# Patient Record
Sex: Male | Born: 1950
Health system: Southern US, Community
[De-identification: ages and names within clinical notes are randomized; demographics above are authoritative.]

## PROBLEM LIST (undated history)

## (undated) DIAGNOSIS — F419 Anxiety disorder, unspecified: Secondary | ICD-10-CM

## (undated) DIAGNOSIS — E7800 Pure hypercholesterolemia, unspecified: Secondary | ICD-10-CM

## (undated) DIAGNOSIS — K219 Gastro-esophageal reflux disease without esophagitis: Secondary | ICD-10-CM

## (undated) DIAGNOSIS — I10 Essential (primary) hypertension: Secondary | ICD-10-CM

## (undated) DIAGNOSIS — J61 Pneumoconiosis due to asbestos and other mineral fibers: Secondary | ICD-10-CM

## (undated) HISTORY — DX: Pure hypercholesterolemia, unspecified: E78.00

## (undated) HISTORY — DX: Pneumoconiosis due to asbestos and other mineral fibers: J61

## (undated) HISTORY — DX: Anxiety disorder, unspecified: F41.9

## (undated) HISTORY — DX: Essential (primary) hypertension: I10

## (undated) HISTORY — DX: Gastro-esophageal reflux disease without esophagitis: K21.9

---

## 2005-01-28 ENCOUNTER — Ambulatory Visit (HOSPITAL_COMMUNITY): Admission: RE | Admit: 2005-01-28 | Discharge: 2005-01-28 | Payer: Self-pay | Admitting: Thoracic Surgery

## 2005-03-08 ENCOUNTER — Inpatient Hospital Stay (HOSPITAL_COMMUNITY): Admission: RE | Admit: 2005-03-08 | Discharge: 2005-03-13 | Payer: Self-pay | Admitting: Thoracic Surgery

## 2005-03-08 ENCOUNTER — Encounter (INDEPENDENT_AMBULATORY_CARE_PROVIDER_SITE_OTHER): Payer: Self-pay | Admitting: *Deleted

## 2005-03-22 ENCOUNTER — Encounter: Admission: RE | Admit: 2005-03-22 | Discharge: 2005-03-22 | Payer: Self-pay | Admitting: Thoracic Surgery

## 2005-04-12 ENCOUNTER — Encounter: Admission: RE | Admit: 2005-04-12 | Discharge: 2005-04-12 | Payer: Self-pay | Admitting: Family Medicine

## 2005-06-01 ENCOUNTER — Encounter: Admission: RE | Admit: 2005-06-01 | Discharge: 2005-06-01 | Payer: Self-pay | Admitting: Thoracic Surgery

## 2005-07-13 ENCOUNTER — Encounter: Admission: RE | Admit: 2005-07-13 | Discharge: 2005-07-13 | Payer: Self-pay | Admitting: Thoracic Surgery

## 2005-10-12 ENCOUNTER — Encounter: Admission: RE | Admit: 2005-10-12 | Discharge: 2005-10-12 | Payer: Self-pay | Admitting: Thoracic Surgery

## 2005-12-05 IMAGING — CR DG CHEST 2V
3 series · 3 of 3 positions shown · non-contrast
Comparison: none

CLINICAL DATA: Followup pleural effusion.
 DIAGNOSTIC CHEST - 2 VIEW:

[view not recorded (1 of 3)]
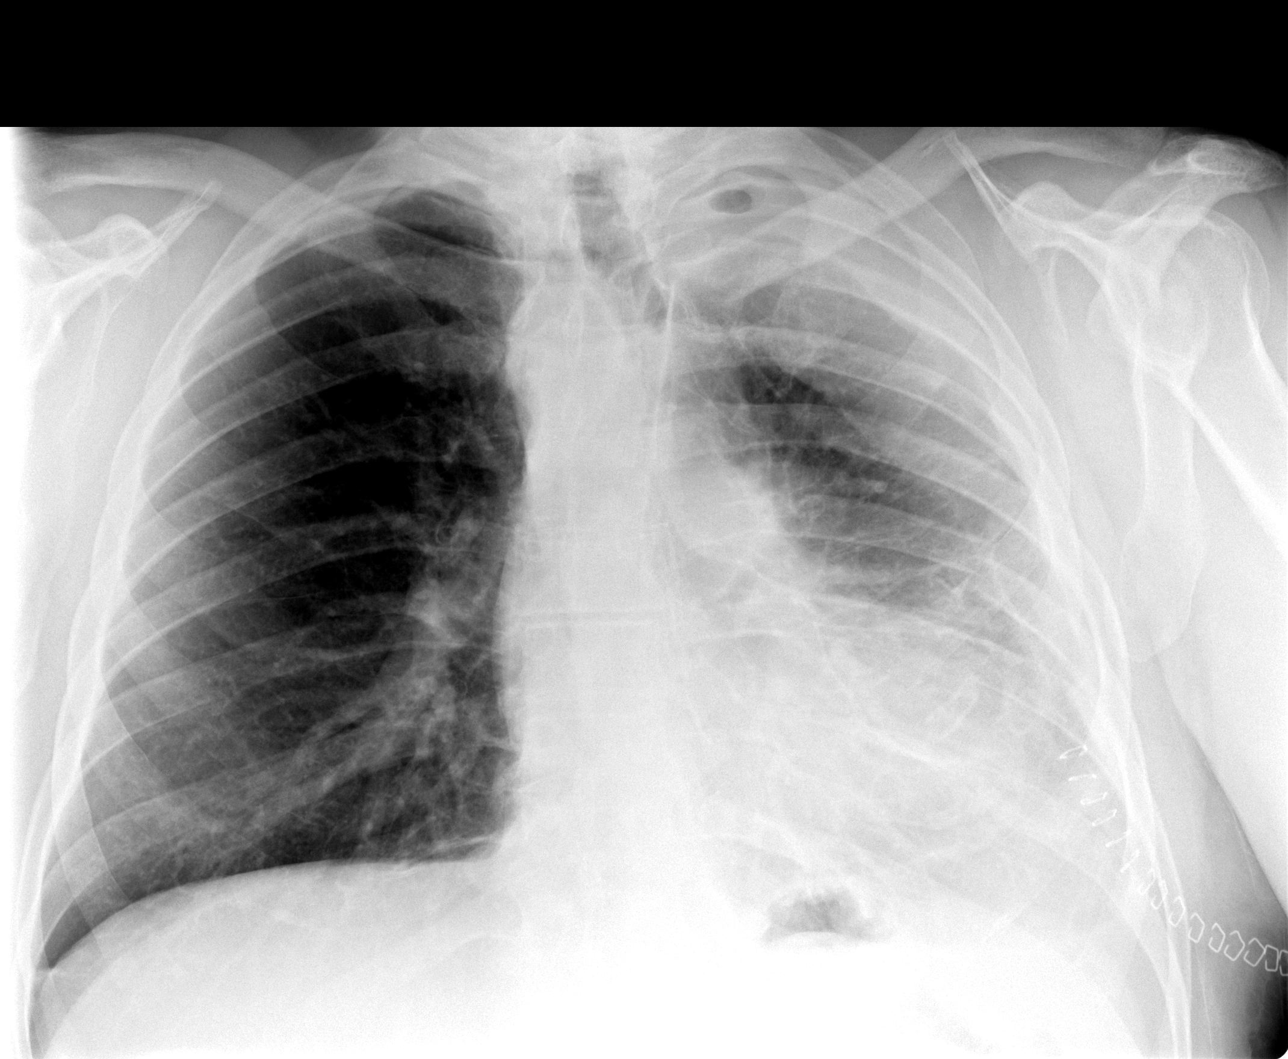

[view not recorded (2 of 3)]
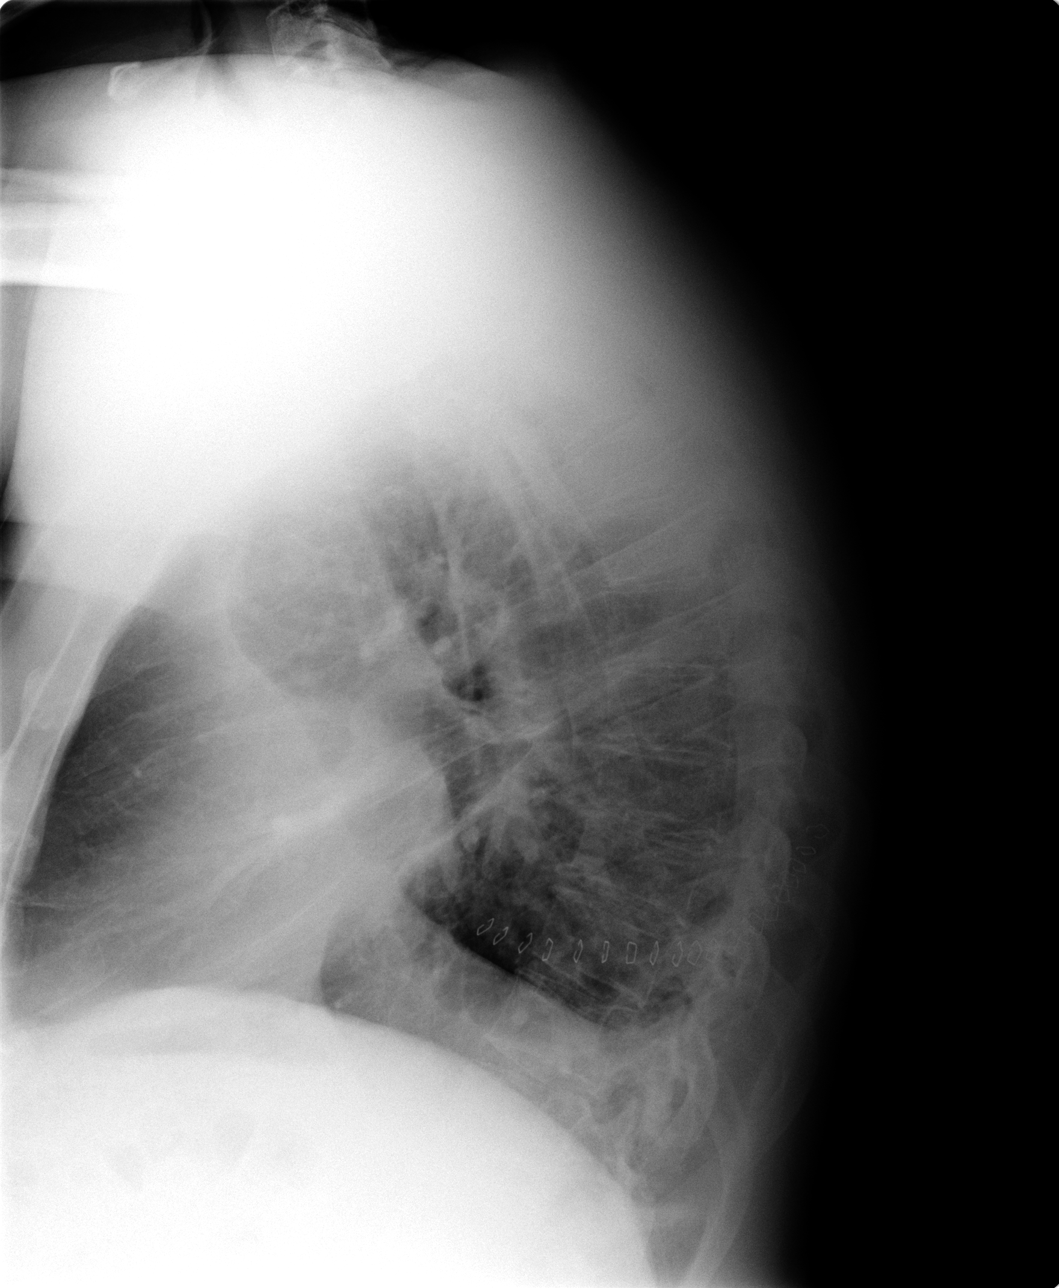

[view not recorded (3 of 3)]
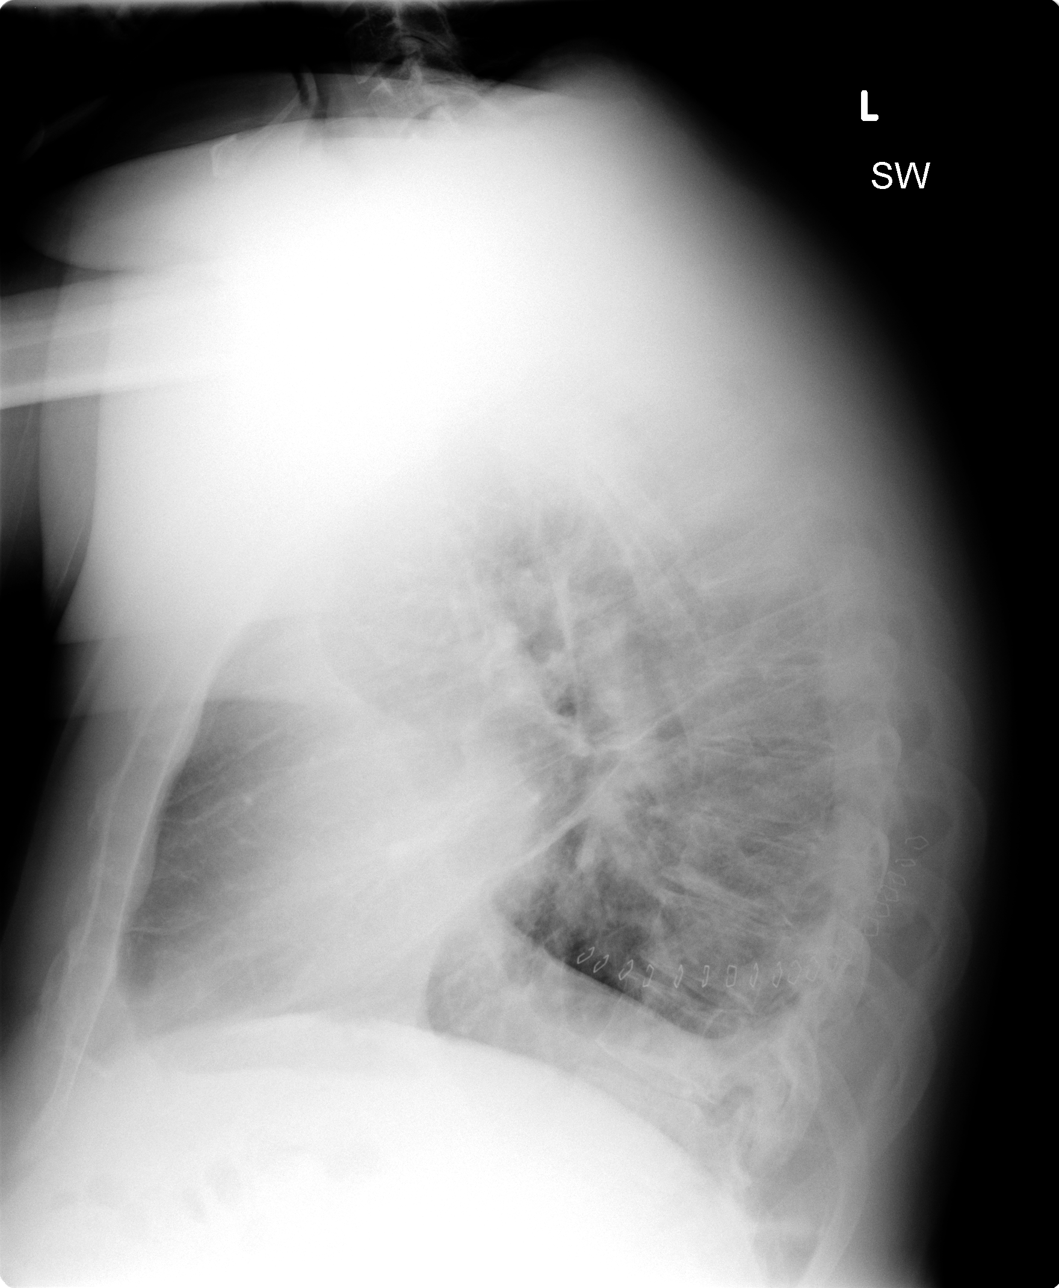

[3 of 3 positions shown; findings below may reference images not displayed]

FINDINGS: Since [REDACTED] chest x-ray 03/13/05 removal of right internal jugular central venous catheter and stable left skin thoracotomy surgical clips are seen.  Currently less than 1 cm tiny left apical loculated pneumothorax is seen with greater aeration of the left lung with otherwise stable postoperative pleural opacity and shift of mediastinum to the left.  Right lung is clear.  Heart size is not assessed.  No new abnormality is seen.
IMPRESSION: Since [REDACTED] chest x-ray 03/13/05:
 1.  Tiny left apical loculated pneumothorax. 
 2.  Greater aeration left lung with otherwise stable left thoracotomy change. 
 3.  Otherwise no active disease.

## 2005-12-26 IMAGING — CR DG CHEST 2V
2 series · 2 of 2 positions shown · non-contrast
Comparison: 03/22/2005

CLINICAL DATA: Pleural effusion

CHEST - 2 VIEW:

[w chest pa]
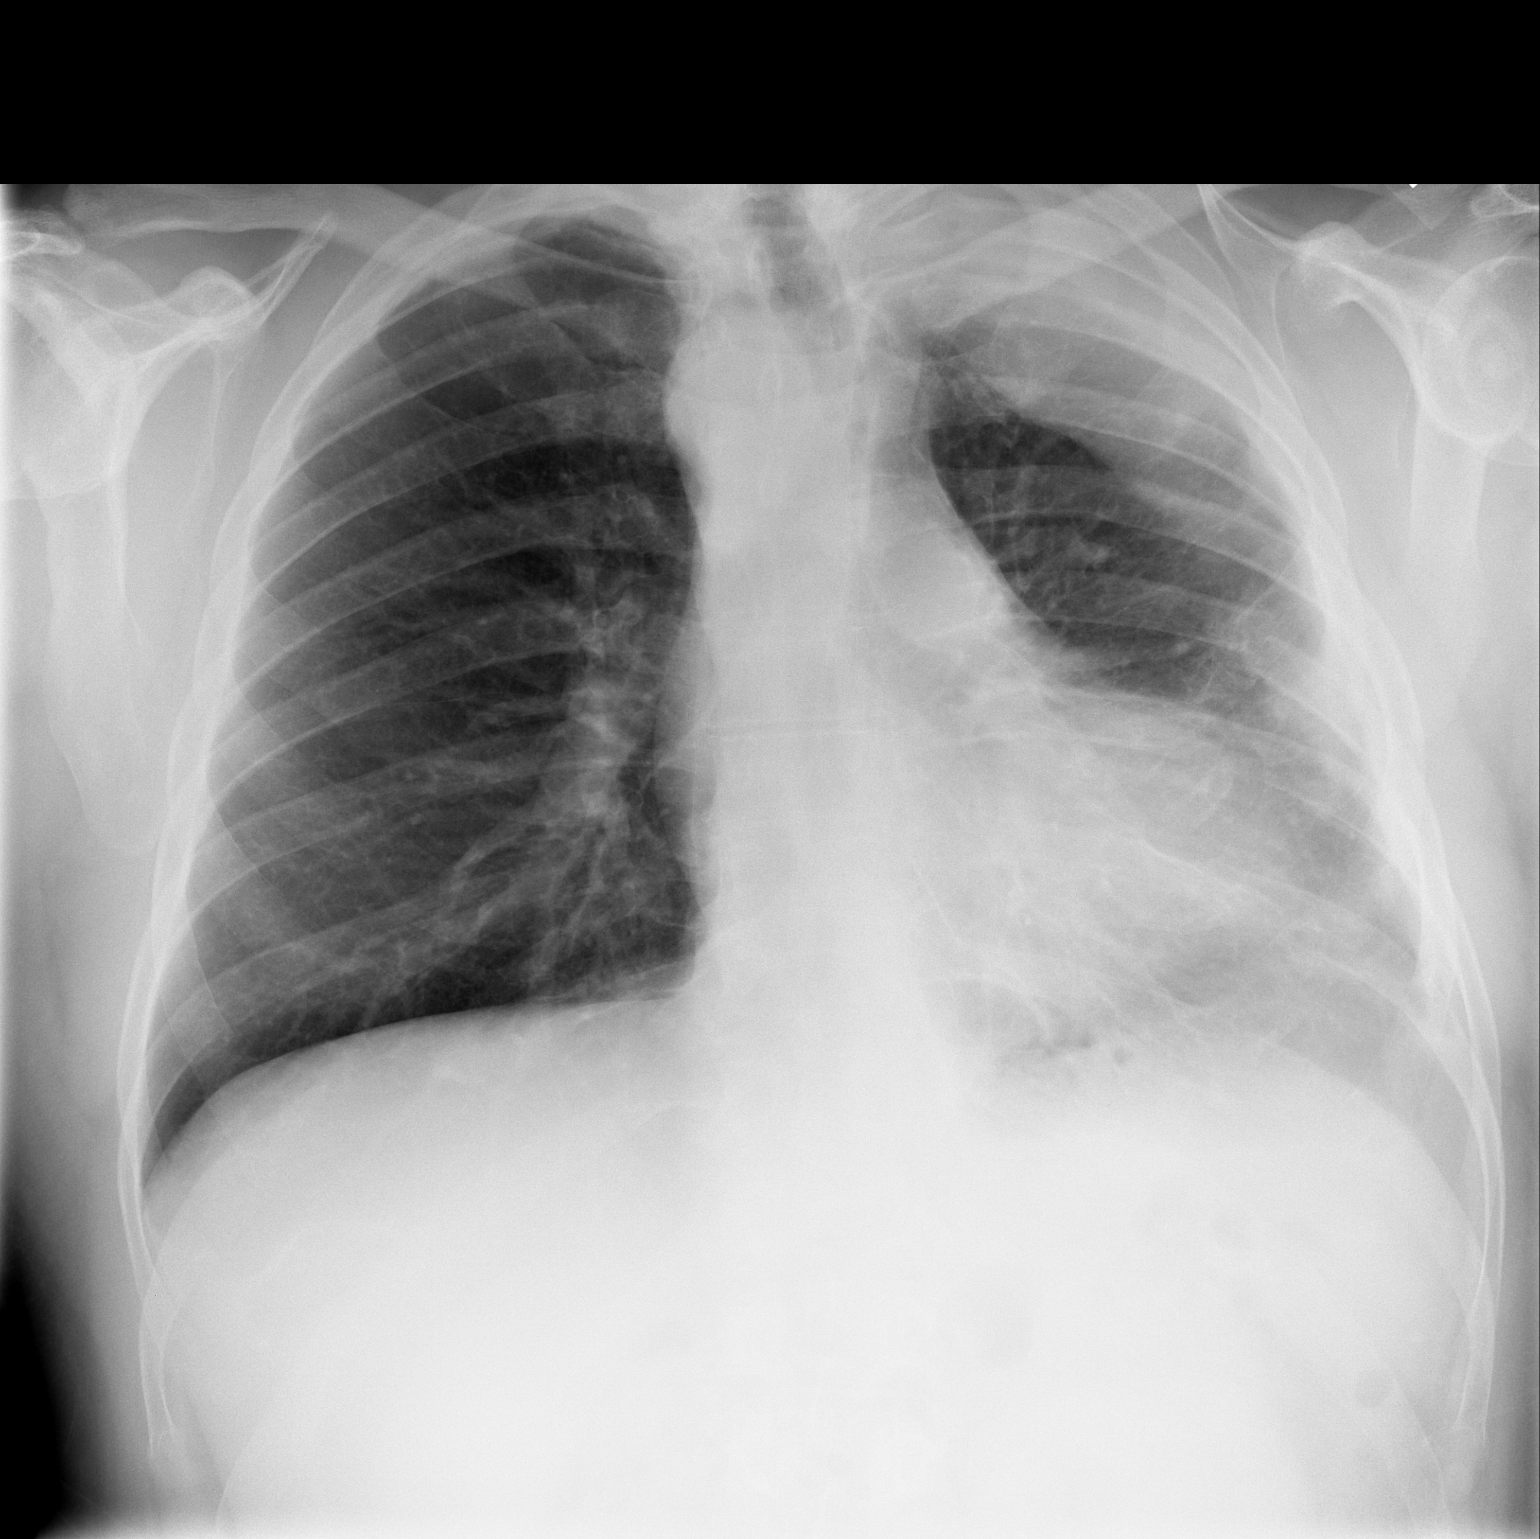

[w chest lat]
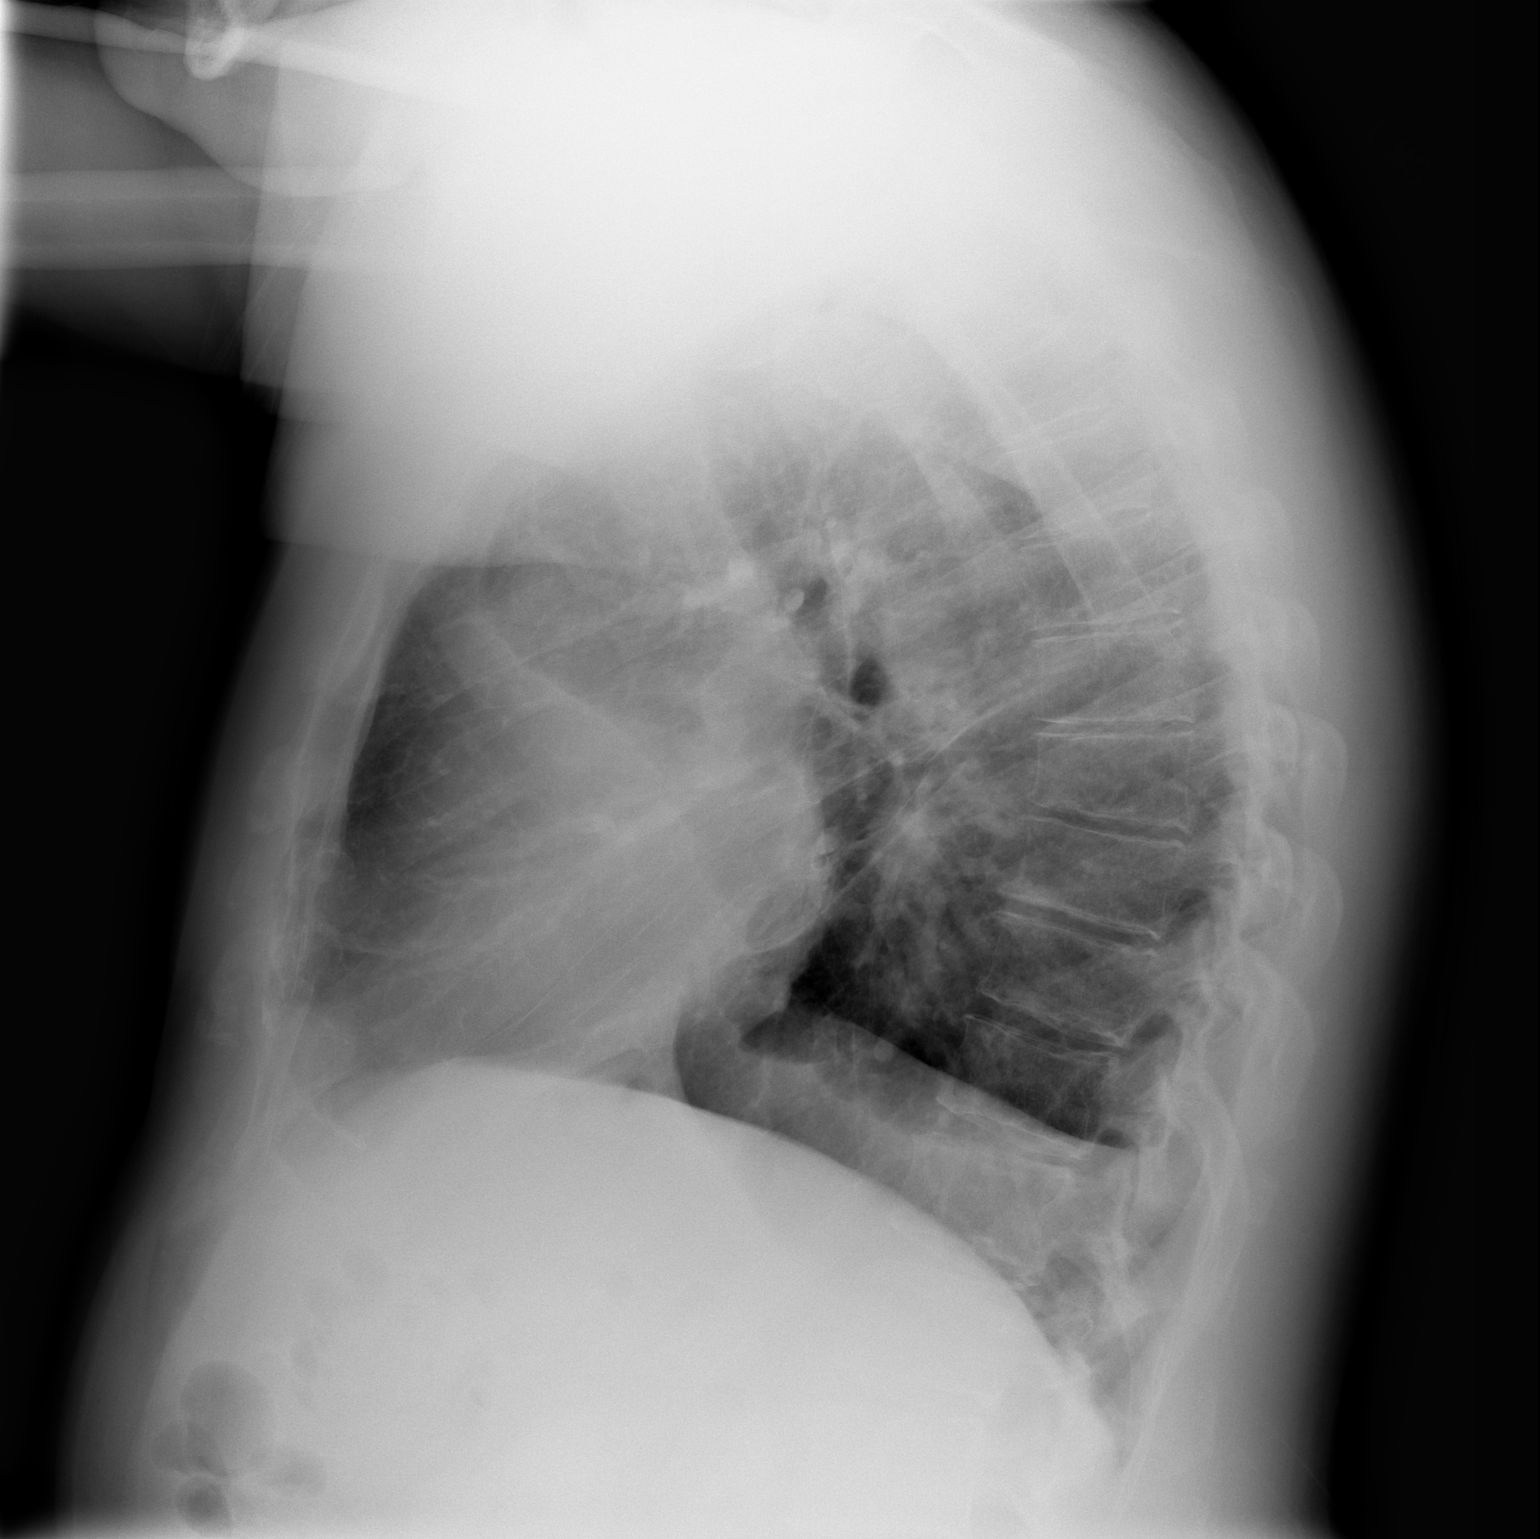

[2 of 2 positions shown; findings below may reference images not displayed]

FINDINGS: Previously small left pneumothorax has resolved. There continue to be
loculated pleural effusion and/or pleural thickening which is essentially
uncinate change since prior study. Right lung remains clear. Heart is normal
size.
IMPRESSION: Resolution of left pneumothorax with stable appearance of the loculated left
effusion and/or thickening.

## 2006-04-12 ENCOUNTER — Encounter: Admission: RE | Admit: 2006-04-12 | Discharge: 2006-04-12 | Payer: Self-pay | Admitting: Thoracic Surgery

## 2006-11-01 ENCOUNTER — Encounter: Admission: RE | Admit: 2006-11-01 | Discharge: 2006-11-01 | Payer: Self-pay | Admitting: Thoracic Surgery

## 2007-05-02 ENCOUNTER — Ambulatory Visit: Payer: Self-pay | Admitting: Thoracic Surgery

## 2007-05-02 ENCOUNTER — Encounter: Admission: RE | Admit: 2007-05-02 | Discharge: 2007-05-02 | Payer: Self-pay | Admitting: Thoracic Surgery

## 2008-05-06 ENCOUNTER — Ambulatory Visit: Payer: Self-pay | Admitting: Thoracic Surgery

## 2008-05-06 ENCOUNTER — Encounter: Admission: RE | Admit: 2008-05-06 | Discharge: 2008-05-06 | Payer: Self-pay | Admitting: Surgery

## 2009-05-20 ENCOUNTER — Encounter: Admission: RE | Admit: 2009-05-20 | Discharge: 2009-05-20 | Payer: Self-pay | Admitting: Thoracic Surgery

## 2009-05-20 ENCOUNTER — Ambulatory Visit: Payer: Self-pay | Admitting: Thoracic Surgery

## 2010-06-29 ENCOUNTER — Ambulatory Visit: Payer: Self-pay | Admitting: Thoracic Surgery

## 2010-06-29 ENCOUNTER — Encounter: Admission: RE | Admit: 2010-06-29 | Discharge: 2010-06-29 | Payer: Self-pay | Admitting: Thoracic Surgery

## 2010-12-19 ENCOUNTER — Encounter: Payer: Self-pay | Admitting: Thoracic Surgery

## 2010-12-20 ENCOUNTER — Encounter: Payer: Self-pay | Admitting: Thoracic Surgery

## 2011-04-12 NOTE — Assessment & Plan Note (Signed)
OFFICE VISIT   LASARO, PRIMM  DOB:  06-25-1951                                        May 06, 2008  CHART #:  [id number]   REASON FOR VISIT:  Mr. Yera comes in today for a 1-year followup.  He  is status post left VAP with drainage of effusion and thoracotomy with  decortication of fibrothorax.  He continues to have chest tightness on  the left, but this has been stable since his last visit.  He has had no  other changes in his medical history.   PHYSICAL EXAMINATION:  Vital Signs:  Blood pressure is 141/81, pulse is  68, respirations 18, and O2 sat 96% on room air.  His incisions have all  healed well.  Heart:  Regular rate and rhythm without murmurs, rubs, or  gallops.  Lungs:  Clear to auscultation.   Chest x-ray is stable from a previous exam.   ASSESSMENT/PLAN:  Mr. Golaszewski is doing well following his left  thoracotomy in 2006.  He has stable chest tightness and is reassured  about this.  Dr. Edwyna Shell has seen the patient today and reviewed his  chest x-ray.  We will plan to see him back again for followup in 1 year  with a repeat x-ray.   Ines Bloomer, M.D.  Electronically Signed   GC/MEDQ  D:  05/06/2008  T:  05/07/2008  Job:  [id number]   cc:   [facility] office

## 2011-04-12 NOTE — Letter (Signed)
June 29, 2010   Marcelino Duster, MD  [address] [place], [place] [address]   Re:  EMAURI, KRYGIER              DOB:  [birth year]   Dear Dr. Orvan Falconer:   The patient came for follow up today.  We have been following for a long  time for this fiber thorax.  His chest x-ray showed no evidence of  really big change.  His blood pressure was 121/74, pulse 60,  respirations 18, sats were 98%.  I will see him back one more year with  another chest x-ray just to continue to follow this.  I appreciate the  opportunity of seeing the patient.   Ines Bloomer, M.D.  Electronically Signed   DPB/MEDQ  D:  06/29/2010  T:  06/30/2010  Job:  [id number]

## 2011-04-12 NOTE — Assessment & Plan Note (Signed)
OFFICE VISIT   Benjamin Colon, Benjamin Colon  DOB:  1951-09-25                                        May 02, 2007  CHART #:  [id number]   OFFICE NOTE:  Chest x-ray stable.  He is still having some mild chest  tightness.  Chest x-ray shows stable chest with no acute pulmonary  process.  Blood pressure is 120/74, pulse 76, respirations 20,  saturating at  98%.  Since he has had no recurrence of his fibrothorax,  we plan to see him again in 1 year with a chest x-ray.   Ines Bloomer, M.D.  Electronically Signed   DPB/MEDQ  D:  05/02/2007  T:  05/02/2007  Job:  [id number]

## 2011-04-12 NOTE — Assessment & Plan Note (Signed)
OFFICE VISIT   Benjamin Colon, Benjamin Colon  DOB:  05/06/1951                                        May 20, 2009  CHART #:  [id number]   The patient came today.  His chest x-ray is stable, which shows no acute  changes.  He has gained a little weight.  He is doing well overall,  still having some tightness in his left chest.  Blood pressure was  125/75, pulse 64, respirations 18, and sats were 97%.  Lungs are clear  to auscultation and percussion.  We will see him back again in 1 year  with another chest x-ray.   Ines Bloomer, M.D.  Electronically Signed   DPB/MEDQ  D:  05/20/2009  T:  05/20/2009  Job:  [id number]

## 2011-04-15 NOTE — Op Note (Signed)
NAME:  Benjamin Colon, Benjamin Colon NO.:  [id number]   MEDICAL RECORD NO.:  [id number]          PATIENT TYPE:  INP   LOCATION:  2899                         FACILITY:  [facility]   PHYSICIAN:  Ines Bloomer, M.D. DATE OF BIRTH:  09-Apr-1951   DATE OF PROCEDURE:  03/08/2005  DATE OF DISCHARGE:                                 OPERATIVE REPORT   PREOPERATIVE DIAGNOSIS:  Left fibrothorax with pleural effusion.   POSTOPERATIVE DIAGNOSIS:  Left fibrothorax with pleural effusion.   OPERATION PERFORMED:  Left VATS, drainage of pleural effusion, left  thoracotomy with decortication.   SURGEON:  Ines Bloomer, M.D.   FIRST ASSISTANT:  Rowe Clack, P.A.-C.   ANESTHESIA:  General anesthesia.   Following general anesthesia, a double-lumen tube was inserted, and he was  turned to the left lateral thoracotomy position.  A Foley catheter was  inserted without too much difficulty, but there was some hematuria after the  insertion of the Foley catheter.  This was watched throughout the case, and  a urology consult was obtained.  After prepping and draping the left chest,  two trocar sites were made in the anterior and posterior axillary lines at  the seventh intercostal space.  Two trocars were inserted, and the pleural  effusion was evacuated and sent for cultures and cytologies.  Then, pictures  were taken of a fibrothorax with thickened pleura, and trapped left lower  lobe and left upper lobe.  Posterolateral thoracotomy was made, and it was  carried down with electrocautery through subcutaneous tissue.  The  latissimus was divided, and the serratus was reflected anteriorly.  Two  trocar sites were placed in the anterior and posterior axillary lines at the  seventh intercostal space.  A Toupet was placed in the wound, and another  Toupet was placed at right angles.  The pleural dissection was started,  taking off the thickened pleura laterally, taking it off superiorly,  inferiorly,  down to the diaphragm, and medially over to the middle  mediastinum and the pericardium.  This thickened pleura was removed by sharp  and blunt dissection using Kaiser ring forceps and dissecting spatula, and  we did really a pleurectomy, removing it up to the apex of the lung and  superiorly around to the middle mediastinum and posteriorly down to the  aorta.  After the superior portion had been removed, the inferior portion  was removed to the diaphragm, removing it out of the costophrenic angle and  the cardiophrenic angle.  Then, the inferior pulmonary ligament was  dissected up, and attention was turned to the thickened peel on the lung.  The lung was completely entrapped, with the upper lobe and the lower lobe.  The fissure could not be identified.  The thickened peel was started to be  removed from the left lower lobe, slowly dissecting the peel up with  scissors, and then using Kittners to help in the dissection.  The entire  peel was removed from the left lower lobe and dissecting superiorly up to  the fissure.  The fissure was identified, and the peel was removed from  the  fissure and then from the superior segment.  After the peel had been removed  from the left lower lobe using the same technique, it was removed from the  left upper lobe.  Frozen section of the peel revealed a benign process.  After the peel had been completely removed, there were several small tears  in the lung that were sutured with 3-0 Vicryl.  Two chest tubes were brought  in through the trocar sites and tied in place with 0 silk.  A third right-  angle chest tube was placed between the two trocar sites.  These were all 28  chest tubes.  Marcaine block was done in the  usual fashion.  The On-Q catheters were placed in the pericostal space.  #2  Vicryl pericostals were used to close the chest.  The chest was closed with  #1 Vicryl in the muscle layer, 2-0 Vicryl in the subcutaneous tissue, and  Ethicon skin  clips.  The patient was returned to the recovery room in stable  condition.      DPB/MEDQ  D:  03/08/2005  T:  03/08/2005  Job:  [id number]   cc:   Ines Bloomer, M.D.  [address]  [place]  [place] [address]   Salvatore Decent. Dorris Fetch, M.D.  [address]  [place]  [place] [address]

## 2011-04-15 NOTE — Discharge Summary (Signed)
NAME:  Benjamin Colon NO.:  [id number]   MEDICAL RECORD NO.:  [id number]          PATIENT TYPE:  INP   LOCATION:  3304                         FACILITY:  [facility]   PHYSICIAN:  Ines Bloomer, M.D. DATE OF BIRTH:  11/20/1951   DATE OF ADMISSION:  03/08/2005  DATE OF DISCHARGE:  03/13/2005                                 DISCHARGE SUMMARY   HISTORY OF PRESENT ILLNESS:  The patient is a 60 year old male with a long  history of chest pain, left pleural effusion.  He was evaluated in [facility] for asbestosis and was told he had a grade I asbestosis.  The patient  has a history of working with [employer].  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  Chest x-  ray and CT scan show a loculated left pleural effusion with scarring of his  left lower lobe.  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  Thoracentesis revealed numerous atypical  mesothelial cells and numerous eosinophils.  He has also been treated for  emphysema in the past.  A PET scan showed no abnormal metabolic activity on  the left side.  CT scan did show some scar on the pleura with atelectasis of  the left lower lobe. He was admitted for drainage of the left pleural  effusion with probable decortication.   PAST MEDICAL HISTORY:  Significant for:  1.  Hypertension.  2.  Gastroesophageal reflux disease.  3.  Hyperlipidemia.  4.  Past surgical history including appendectomy, tonsillectomy.   ALLERGIES:  No known drug allergies.   MEDICATIONS:  Prior to admission:  1.  Hyzaar 100/25, one daily.  2.  Vytorin 10/20 one daily.  3.  Nexium 40 mg daily.   FAMILY AND SOCIAL HISTORY/REVIEW OF SYMPTOMS, PHYSICAL EXAMINATION:  Please  see the history and physical done at the time of admission.   HOSPITAL COURSE:  The patient was admitted electively with the impression of  left chronic fibrothorax and loculated  effusion. He was taken to the  operating room on March 08, 2005 where he underwent a left video-assisted  thoracoscopy/thoracotomy with drainage of effusion with visceral and  parietal pleurectomy.  Multiple biopsies were obtained as well as cultures  and Cytology.  The patient tolerated the procedure well and was taken to the  post-anesthesia care unit in stable condition.   Postoperatively during his hospital course the patient did well.  It is  noted the patient did require multiple attempts to place the Foley catheter  prior to his procedure.  A urology consultation was obtained postoperatively  and there was no sequelae due to this.  The patient remained hemodynamically  stable.  He advanced in a routine manner in regards to activity and diet.  All routine lines, monitors, drainages devices were discontinued in a  standard fashion.  The incision healed well without evidence of infection or  difficulties.  Pathology was noted as follows:  The left pleural fluid  showed a mixed lymphocyte population with  rare mesothelial cells.  A biopsy  of the left pleura showed pleura to have fibrosis with predominantly chronic  inflammatory infiltrate and fibrous plaque formation.  A portion of the left  sixth rib showed no evidence of malignancy.  An initial biopsy of the left  pleura again showed pleural fibrosis and predominantly chronic inflammatory  infiltrate.   Overall, the patient did quite well in regards to his surgical recovery and  was deemed acceptable for discharge on March 13, 2005 after evaluation by  Dr. Edwyna Shell.   DISCHARGE MEDICATIONS:  Medications on discharge were as preoperatively.  Additionally for pain, Tylox one or two every 4-6 hours as needed.   DISCHARGE INSTRUCTIONS:  Patient received written instructions regarding his  medications, activity, diet, wound care and follow up.  Follow up will  include follow up with Dr. Edwyna Shell as arranged.   CONDITION ON DISCHARGE:   Stable, improved.   FINAL DIAGNOSES:  1.  Pathology as noted above for left chronic fibrothorax and loculated      effusions.  2.  Other diagnosis as previously listed per the dictation history.      Benjamin Colon   WEG/MEDQ  D:  06/15/2005  T:  06/16/2005  Job:  [id number]

## 2011-04-15 NOTE — Consult Note (Signed)
NAME:  Benjamin Colon, Benjamin Colon NO.:  [id number]   MEDICAL RECORD NO.:  [id number]          PATIENT TYPE:  INP   LOCATION:  3304                         FACILITY:  [facility]   PHYSICIAN:  Alessandra Bevels. Chase Picket, FNP-CDATE OF BIRTH:  Dec 04, 1950   DATE OF CONSULTATION:  03/08/2005  DATE OF DISCHARGE:                                   CONSULTATION   This is a consult for hematuria after Foley catheter insertion in  preparation for left VATS.  Catheter was difficult to insert, requiring  multiple attempts.   HISTORY OF PRESENT ILLNESS:  A 60 year old white male with recurrent left  pleural effusion and left pleuritic chest pain.  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  Was told he had grade I  asbestosis.  Evaluated by Dr. Edwyna Shell with extensive workup and found it  necessary to proceed with left VATS.   PAST MEDICAL HISTORY:  Significant for hypertension, GERD, hyperlipidemia,  tonsillectomy, appendectomy.   MEDICATIONS:  1.  Hyzaar 100/25 mg daily.  2.  Vytorin 10/20 mg daily.  3.  Nexium 40 mg daily.   SOCIAL HISTORY:  Nonsmoker, nondrinker.  No illegal drug use.  Married.  Works as a Merchandiser, retail for [employer].   FAMILY HISTORY:  Negative for cancer.  Positive for CAD and positive for  BPH.   REVIEW OF SYSTEMS:  Preoperatively, negative fever.  Negative chills.  Negative headache.  Left pleuritic chest pain.  Positive for shortness of  breath, reflux.  Negative melena.  Negative urgency, frequency.  Mild  hesitancy.  Previous prostatitis years ago.  Negative hematuria.  Chronic  bilateral knee pain, otherwise benign.   PHYSICAL EXAMINATION:  VITAL SIGNS:  Temp 99.3, pulse 72, respirations 20,  blood pressure 108/62.  Sats 99% on 3 liters O2.  GENERAL:  A postoperative 60 year old white male in no acute distress  without complaints.  HEENT:  Normocephalic with central line dressing and catheter on the right  side of the neck.  RESPIRATORY:  Diminished with audible air leak.  CARDIAC:  Regular rate and rhythm.  ABDOMEN:  Soft.  Nondistended.  Negative bowel sounds.  RECTAL:  Deferred.  GENITOURINARY:  Foreskin, meatus, penis, scrotum, testicles without lesions  or masses.  Old drainage and dried blood noticed at meatus.  Foley to  bedside drainage.  Urine and tubing essentially clear with mild pink tinge.  Urine in bag with brownish color.  No clots in bag.  No clots reported.  EXTREMITIES:  Negative edema.  PAS stockings in place.  NEUROLOGIC:  Alert and oriented x 3 with appropriate mood and affect.   LABS:  Preoperative UA clear.   IMPRESSION:  1.  Hematuria:  Probably trauma during Foley catheter insertion.  2.  Status post video-assisted thoracoscopic surgery with recurrent left      pleural effusion.  3.  Hypertension.  4.  Gastroesophageal reflux disease.  5.  Chronic obstructive pulmonary disease.  6.  Hyperlipidemia.   PLAN:  Foley catheter draining well.  If drainage decreases or appearance of  clots, may irrigate p.r.n., otherwise may DC  Foley when medically necessary.  The patient will need to follow up at the urology center for cystoscopy by  Dr. Darvin Neighbours.      JML/MEDQ  D:  03/08/2005  T:  03/08/2005  Job:  [id number]

## 2011-04-15 NOTE — H&P (Signed)
NAME:  Benjamin Colon, Benjamin Colon NO.:  [id number]   MEDICAL RECORD NO.:  [id number]          PATIENT TYPE:  INP   LOCATION:  NA                           FACILITY:  [facility]   PHYSICIAN:  Ines Bloomer, M.D. DATE OF BIRTH:  1951-11-27   DATE OF ADMISSION:  03/08/2005  DATE OF DISCHARGE:                                HISTORY & PHYSICAL   HISTORY OF PRESENT ILLNESS:  Recurrent left pleural effusion.   CHIEF COMPLAINT:  Recurrent left pleural effusion.   HISTORY OF PRESENT ILLNESS:  This 60 year old patient has a long history of  left chest pain, left pleural effusion.  He was evaluated in [place]  by Dr. __________ for asbestosis and was told he had grade 1 asbestosis.  He  apparently works for [employer].  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  Chest x-ray and a CT scan  that showed a loculated left pleural effusion with scarring of his left  lower lobe.  CT scan also showed that he had a right arch with an aberrant  left subclavian artery.  Thoracentesis revealed numerous atypical  mesothelial cells and numerous eosinophils.  He also had been treated for  emphysema in the past.  Ventilation perfusion scan showed __________.  He  had a PET scan that showed no abnormal metabolic activity on the left side.  CT scan did show some scar on the pleura with atelectasis of the left lower  lobe.  He is admitted for drainage of left pleural effusion with probable  decortication.   PAST MEDICAL HISTORY:  Is significant for:  1.  Hypertension.  2.  GERD.  3.  Hyperlipidemia.  4.  Previously he has had an appendectomy and a tonsillectomy.   He is NOT ALLERGIC TO ANY MEDICATION.   He is on:  1.  Hyzaar 100/25 daily.  2.  Vytorin 10-20 daily.  3.  Nexium 40 mg daily.   FAMILY HISTORY:  There is no family history of cancer in his parents.  Mother had vascular disease.   SOCIAL HISTORY:  He works as a Advertising account planner.  He does not smoke,  does not drink alcohol on a regular basis.  He is married and has 3  children.   REVIEW OF SYSTEMS:  He is 285 pounds, he is 6 feet 2 inches.  He has left  pleuritic chest pain, shortness of breath.  He has no change in his weight.  He has occasional wheezing, no excessive sputum.  CARDIAC:  No angina or  history of arrhythmias.  GI:  He has had chronic GERD but no abdominal pain,  constipation, nausea or vomiting.  GU:  No frequent urinations or kidney  stones.  VASCULAR:  No history of claudication, TIAs or DVT.  NEUROLOGICAL:  No history of dizziness, blackout or seizures.  ORTHOPEDICS:  He has chronic  joint pains in both of his knees.  PSYCHIATRIC:  No history of psychiatric  illnesses.  EYE/ENT:  No change in his eyesight.  HEMATOLOGICAL:  No history  of anemia.   Generally, he is well-developed, slightly obese Caucasian male in no acute  distress.  Blood pressure is 144/80, pulse 66, respirations 18, temperature  93%.  HEAD:  Atraumatic.  EYES:  Pupils equal, react to light and accommodation.  Extraocular  movements are normal.  EARS:  Tympanic membranes are intact.  NOSE:  There is no septal deviation.  THROAT:  Without lesions.  NECK:  Supple, there is no thyromegaly, no jugular venous distention.  CHEST:  Clear to auscultation and percussion, except the lower lobe where  there is decreased breath sounds.  HEART:  Regular sinus rhythm, no murmurs.  ABDOMEN:  Obese with no hepatosplenomegaly, bowel sounds are normal.  EXTREMITIES:  Pulses are 2+, there is no clubbing or edema.  NEUROLOGICAL:  Oriented x3.  Sensory and motor intact.  Cranial nerves II-  XII are intact.  SKIN:  Without lesion.   IMPRESSION:  1.  Left chronic fibrothorax with loculated effusion.  2.  Chronic obstructive pulmonary disease.  3.  Hypertension.  4.  Hyperlipidemia.  5.  Questionable asbestos exposure.   PLAN:  Left VATS with decortication.   SURGEON:  Dorita Sciara, M.D.      DPB/MEDQ  D:  03/06/2005  T:  03/06/2005  Job:  [id number]

## 2011-07-04 ENCOUNTER — Other Ambulatory Visit: Payer: Self-pay | Admitting: Thoracic Surgery

## 2011-07-04 DIAGNOSIS — J90 Pleural effusion, not elsewhere classified: Secondary | ICD-10-CM

## 2011-07-05 ENCOUNTER — Encounter (INDEPENDENT_AMBULATORY_CARE_PROVIDER_SITE_OTHER): Payer: 59 | Admitting: Thoracic Surgery

## 2011-07-05 ENCOUNTER — Ambulatory Visit
Admission: RE | Admit: 2011-07-05 | Discharge: 2011-07-05 | Disposition: A | Discharge status: Home / Self Care | Payer: 59 | Source: Ambulatory Visit | Attending: Thoracic Surgery | Admitting: Thoracic Surgery

## 2011-07-05 DIAGNOSIS — J8410 Pulmonary fibrosis, unspecified: Secondary | ICD-10-CM

## 2011-07-05 DIAGNOSIS — J90 Pleural effusion, not elsewhere classified: Secondary | ICD-10-CM

## 2011-07-05 NOTE — Assessment & Plan Note (Signed)
OFFICE VISIT  Benjamin Colon, Benjamin Colon DOB:  1951-06-25                                        July 05, 2011 CHART #:  [id number]  The patient came today where is now about 6 years we have been following him for followup.  He had had a decortication after fibrothorax.  His chest x-ray, stable.  There is no evidence of any progression of vague, scar.  He still has some tiny on the left side.  His lungs are clear to auscultation and percussion.  His blood pressure is 110/60, pulse 60, respirations 96.  Plan to see him back again if he has any future problems.  Ines Bloomer, M.D. Electronically Signed  DPB/MEDQ  D:  07/05/2011  T:  07/05/2011  Job:  [id number]  cc:   Marcelino Duster

## 2011-07-19 ENCOUNTER — Encounter: Payer: Self-pay | Admitting: Thoracic Surgery

## 2015-06-29 HISTORY — PX: PROSTATE BIOPSY: SHX241

## 2016-02-17 ENCOUNTER — Other Ambulatory Visit (HOSPITAL_COMMUNITY): Payer: Self-pay | Admitting: Urology

## 2016-02-17 ENCOUNTER — Other Ambulatory Visit: Payer: Self-pay | Admitting: Urology

## 2016-02-17 DIAGNOSIS — R972 Elevated prostate specific antigen [PSA]: Secondary | ICD-10-CM

## 2016-02-25 ENCOUNTER — Ambulatory Visit (HOSPITAL_COMMUNITY)
Admission: RE | Admit: 2016-02-25 | Discharge: 2016-02-25 | Disposition: A | Discharge status: Home / Self Care | Payer: 59 | Source: Ambulatory Visit | Attending: Urology | Admitting: Urology

## 2016-02-25 DIAGNOSIS — R932 Abnormal findings on diagnostic imaging of liver and biliary tract: Secondary | ICD-10-CM | POA: Insufficient documentation

## 2016-02-25 DIAGNOSIS — N400 Enlarged prostate without lower urinary tract symptoms: Secondary | ICD-10-CM | POA: Insufficient documentation

## 2016-02-25 DIAGNOSIS — R972 Elevated prostate specific antigen [PSA]: Secondary | ICD-10-CM | POA: Insufficient documentation

## 2016-02-25 LAB — POCT I-STAT CREATININE: Creatinine, Ser: 1 mg/dL (ref 0.61–1.24)

## 2016-02-25 MED ORDER — GADOBENATE DIMEGLUMINE 529 MG/ML IV SOLN
20.0000 mL | Freq: Once | INTRAVENOUS | Status: AC | PRN
  Administered 2016-02-25: 20 mL via INTRAVENOUS

## 2016-02-26 ENCOUNTER — Ambulatory Visit (HOSPITAL_COMMUNITY): Payer: Self-pay

## 2016-02-27 HISTORY — PX: PROSTATE BIOPSY: SHX241

## 2017-03-01 DIAGNOSIS — C44519 Basal cell carcinoma of skin of other part of trunk: Secondary | ICD-10-CM | POA: Diagnosis not present

## 2017-03-01 DIAGNOSIS — Z85828 Personal history of other malignant neoplasm of skin: Secondary | ICD-10-CM | POA: Diagnosis not present

## 2017-03-01 DIAGNOSIS — L570 Actinic keratosis: Secondary | ICD-10-CM | POA: Diagnosis not present

## 2017-03-01 DIAGNOSIS — D485 Neoplasm of uncertain behavior of skin: Secondary | ICD-10-CM | POA: Diagnosis not present

## 2017-03-08 DIAGNOSIS — K219 Gastro-esophageal reflux disease without esophagitis: Secondary | ICD-10-CM | POA: Diagnosis not present

## 2017-03-08 DIAGNOSIS — E782 Mixed hyperlipidemia: Secondary | ICD-10-CM | POA: Diagnosis not present

## 2017-03-08 DIAGNOSIS — R7301 Impaired fasting glucose: Secondary | ICD-10-CM | POA: Diagnosis not present

## 2017-03-08 DIAGNOSIS — I10 Essential (primary) hypertension: Secondary | ICD-10-CM | POA: Diagnosis not present

## 2017-03-08 DIAGNOSIS — D72829 Elevated white blood cell count, unspecified: Secondary | ICD-10-CM | POA: Diagnosis not present

## 2017-03-08 DIAGNOSIS — D126 Benign neoplasm of colon, unspecified: Secondary | ICD-10-CM | POA: Diagnosis not present

## 2017-03-09 DIAGNOSIS — C44519 Basal cell carcinoma of skin of other part of trunk: Secondary | ICD-10-CM | POA: Diagnosis not present

## 2017-03-13 DIAGNOSIS — R972 Elevated prostate specific antigen [PSA]: Secondary | ICD-10-CM | POA: Diagnosis not present

## 2017-03-13 DIAGNOSIS — D126 Benign neoplasm of colon, unspecified: Secondary | ICD-10-CM | POA: Diagnosis not present

## 2017-03-13 DIAGNOSIS — E782 Mixed hyperlipidemia: Secondary | ICD-10-CM | POA: Diagnosis not present

## 2017-03-13 DIAGNOSIS — R7301 Impaired fasting glucose: Secondary | ICD-10-CM | POA: Diagnosis not present

## 2017-03-13 DIAGNOSIS — D72829 Elevated white blood cell count, unspecified: Secondary | ICD-10-CM | POA: Diagnosis not present

## 2017-03-13 DIAGNOSIS — Z85820 Personal history of malignant melanoma of skin: Secondary | ICD-10-CM | POA: Diagnosis not present

## 2017-03-13 DIAGNOSIS — I10 Essential (primary) hypertension: Secondary | ICD-10-CM | POA: Diagnosis not present

## 2017-03-13 DIAGNOSIS — K219 Gastro-esophageal reflux disease without esophagitis: Secondary | ICD-10-CM | POA: Diagnosis not present

## 2017-04-12 DIAGNOSIS — R972 Elevated prostate specific antigen [PSA]: Secondary | ICD-10-CM | POA: Diagnosis not present

## 2017-05-15 DIAGNOSIS — H1013 Acute atopic conjunctivitis, bilateral: Secondary | ICD-10-CM | POA: Diagnosis not present

## 2017-05-15 DIAGNOSIS — H40033 Anatomical narrow angle, bilateral: Secondary | ICD-10-CM | POA: Diagnosis not present

## 2017-09-14 DIAGNOSIS — Z85820 Personal history of malignant melanoma of skin: Secondary | ICD-10-CM | POA: Diagnosis not present

## 2017-09-14 DIAGNOSIS — E559 Vitamin D deficiency, unspecified: Secondary | ICD-10-CM | POA: Diagnosis not present

## 2017-09-14 DIAGNOSIS — D72829 Elevated white blood cell count, unspecified: Secondary | ICD-10-CM | POA: Diagnosis not present

## 2017-09-14 DIAGNOSIS — E782 Mixed hyperlipidemia: Secondary | ICD-10-CM | POA: Diagnosis not present

## 2017-09-14 DIAGNOSIS — K219 Gastro-esophageal reflux disease without esophagitis: Secondary | ICD-10-CM | POA: Diagnosis not present

## 2017-09-14 DIAGNOSIS — I10 Essential (primary) hypertension: Secondary | ICD-10-CM | POA: Diagnosis not present

## 2017-09-14 DIAGNOSIS — R7301 Impaired fasting glucose: Secondary | ICD-10-CM | POA: Diagnosis not present

## 2017-09-14 DIAGNOSIS — R972 Elevated prostate specific antigen [PSA]: Secondary | ICD-10-CM | POA: Diagnosis not present

## 2017-09-18 DIAGNOSIS — R7301 Impaired fasting glucose: Secondary | ICD-10-CM | POA: Diagnosis not present

## 2017-09-18 DIAGNOSIS — L570 Actinic keratosis: Secondary | ICD-10-CM | POA: Diagnosis not present

## 2017-09-18 DIAGNOSIS — E782 Mixed hyperlipidemia: Secondary | ICD-10-CM | POA: Diagnosis not present

## 2017-09-18 DIAGNOSIS — D126 Benign neoplasm of colon, unspecified: Secondary | ICD-10-CM | POA: Diagnosis not present

## 2017-09-18 DIAGNOSIS — I10 Essential (primary) hypertension: Secondary | ICD-10-CM | POA: Diagnosis not present

## 2017-09-18 DIAGNOSIS — Z0001 Encounter for general adult medical examination with abnormal findings: Secondary | ICD-10-CM | POA: Diagnosis not present

## 2017-09-18 DIAGNOSIS — Z85828 Personal history of other malignant neoplasm of skin: Secondary | ICD-10-CM | POA: Diagnosis not present

## 2017-09-18 DIAGNOSIS — R972 Elevated prostate specific antigen [PSA]: Secondary | ICD-10-CM | POA: Diagnosis not present

## 2017-09-18 DIAGNOSIS — D72829 Elevated white blood cell count, unspecified: Secondary | ICD-10-CM | POA: Diagnosis not present

## 2017-09-18 DIAGNOSIS — Z23 Encounter for immunization: Secondary | ICD-10-CM | POA: Diagnosis not present

## 2017-09-20 DIAGNOSIS — R9431 Abnormal electrocardiogram [ECG] [EKG]: Secondary | ICD-10-CM | POA: Diagnosis not present

## 2017-12-01 ENCOUNTER — Other Ambulatory Visit (HOSPITAL_COMMUNITY)
Admission: RE | Admit: 2017-12-01 | Discharge: 2017-12-01 | Disposition: A | Discharge status: Home / Self Care | Payer: Medicare Other | Source: Other Acute Inpatient Hospital | Attending: Family Medicine | Admitting: Family Medicine

## 2017-12-01 ENCOUNTER — Ambulatory Visit: Payer: Medicare Other | Admitting: Urology

## 2017-12-01 DIAGNOSIS — R972 Elevated prostate specific antigen [PSA]: Secondary | ICD-10-CM

## 2017-12-01 DIAGNOSIS — N390 Urinary tract infection, site not specified: Secondary | ICD-10-CM | POA: Insufficient documentation

## 2017-12-01 LAB — URINALYSIS, COMPLETE (UACMP) WITH MICROSCOPIC
BILIRUBIN URINE: NEGATIVE
GLUCOSE, UA: NEGATIVE mg/dL
KETONES UR: NEGATIVE mg/dL
Nitrite: POSITIVE — AB
Protein, ur: NEGATIVE mg/dL
Specific Gravity, Urine: 1.017 (ref 1.005–1.030)
pH: 6 (ref 5.0–8.0)

## 2017-12-03 LAB — URINE CULTURE: Culture: >=100000 — AB

## 2018-03-20 DIAGNOSIS — L821 Other seborrheic keratosis: Secondary | ICD-10-CM | POA: Diagnosis not present

## 2018-03-20 DIAGNOSIS — R972 Elevated prostate specific antigen [PSA]: Secondary | ICD-10-CM | POA: Diagnosis not present

## 2018-03-20 DIAGNOSIS — N390 Urinary tract infection, site not specified: Secondary | ICD-10-CM | POA: Diagnosis not present

## 2018-03-20 DIAGNOSIS — Z85828 Personal history of other malignant neoplasm of skin: Secondary | ICD-10-CM | POA: Diagnosis not present

## 2018-03-20 DIAGNOSIS — L570 Actinic keratosis: Secondary | ICD-10-CM | POA: Diagnosis not present

## 2018-03-23 DIAGNOSIS — R7301 Impaired fasting glucose: Secondary | ICD-10-CM | POA: Diagnosis not present

## 2018-03-23 DIAGNOSIS — Z1331 Encounter for screening for depression: Secondary | ICD-10-CM | POA: Diagnosis not present

## 2018-03-23 DIAGNOSIS — E782 Mixed hyperlipidemia: Secondary | ICD-10-CM | POA: Diagnosis not present

## 2018-03-23 DIAGNOSIS — R972 Elevated prostate specific antigen [PSA]: Secondary | ICD-10-CM | POA: Diagnosis not present

## 2018-03-23 DIAGNOSIS — I10 Essential (primary) hypertension: Secondary | ICD-10-CM | POA: Diagnosis not present

## 2018-03-23 DIAGNOSIS — Z1389 Encounter for screening for other disorder: Secondary | ICD-10-CM | POA: Diagnosis not present

## 2018-03-23 DIAGNOSIS — M7501 Adhesive capsulitis of right shoulder: Secondary | ICD-10-CM | POA: Diagnosis not present

## 2018-03-29 DIAGNOSIS — M2560 Stiffness of unspecified joint, not elsewhere classified: Secondary | ICD-10-CM | POA: Diagnosis not present

## 2018-03-29 DIAGNOSIS — I10 Essential (primary) hypertension: Secondary | ICD-10-CM | POA: Diagnosis not present

## 2018-03-29 DIAGNOSIS — Z77090 Contact with and (suspected) exposure to asbestos: Secondary | ICD-10-CM | POA: Diagnosis not present

## 2018-03-29 DIAGNOSIS — M25611 Stiffness of right shoulder, not elsewhere classified: Secondary | ICD-10-CM | POA: Diagnosis not present

## 2018-03-29 DIAGNOSIS — M25511 Pain in right shoulder: Secondary | ICD-10-CM | POA: Diagnosis not present

## 2018-03-29 DIAGNOSIS — M6281 Muscle weakness (generalized): Secondary | ICD-10-CM | POA: Diagnosis not present

## 2018-03-29 DIAGNOSIS — M62511 Muscle wasting and atrophy, not elsewhere classified, right shoulder: Secondary | ICD-10-CM | POA: Diagnosis not present

## 2018-03-30 ENCOUNTER — Ambulatory Visit: Payer: Medicare Other | Admitting: Urology

## 2018-03-30 ENCOUNTER — Other Ambulatory Visit (HOSPITAL_COMMUNITY)
Admission: AD | Admit: 2018-03-30 | Discharge: 2018-03-30 | Disposition: A | Discharge status: Home / Self Care | Payer: Medicare Other | Source: Other Acute Inpatient Hospital | Attending: Urology | Admitting: Urology

## 2018-03-30 DIAGNOSIS — N390 Urinary tract infection, site not specified: Secondary | ICD-10-CM | POA: Diagnosis not present

## 2018-03-30 DIAGNOSIS — R972 Elevated prostate specific antigen [PSA]: Secondary | ICD-10-CM

## 2018-03-30 DIAGNOSIS — R310 Gross hematuria: Secondary | ICD-10-CM | POA: Diagnosis not present

## 2018-04-01 LAB — URINE CULTURE: Culture: >=100000 — AB

## 2018-04-04 ENCOUNTER — Other Ambulatory Visit: Payer: Self-pay | Admitting: Urology

## 2018-04-04 DIAGNOSIS — M2560 Stiffness of unspecified joint, not elsewhere classified: Secondary | ICD-10-CM | POA: Diagnosis not present

## 2018-04-04 DIAGNOSIS — Z77090 Contact with and (suspected) exposure to asbestos: Secondary | ICD-10-CM | POA: Diagnosis not present

## 2018-04-04 DIAGNOSIS — R319 Hematuria, unspecified: Secondary | ICD-10-CM

## 2018-04-04 DIAGNOSIS — N390 Urinary tract infection, site not specified: Secondary | ICD-10-CM

## 2018-04-04 DIAGNOSIS — M62511 Muscle wasting and atrophy, not elsewhere classified, right shoulder: Secondary | ICD-10-CM | POA: Diagnosis not present

## 2018-04-04 DIAGNOSIS — M25611 Stiffness of right shoulder, not elsewhere classified: Secondary | ICD-10-CM | POA: Diagnosis not present

## 2018-04-04 DIAGNOSIS — I10 Essential (primary) hypertension: Secondary | ICD-10-CM | POA: Diagnosis not present

## 2018-04-04 DIAGNOSIS — M25511 Pain in right shoulder: Secondary | ICD-10-CM | POA: Diagnosis not present

## 2018-04-04 DIAGNOSIS — M6281 Muscle weakness (generalized): Secondary | ICD-10-CM | POA: Diagnosis not present

## 2018-04-04 DIAGNOSIS — R310 Gross hematuria: Secondary | ICD-10-CM

## 2018-04-06 DIAGNOSIS — Z77090 Contact with and (suspected) exposure to asbestos: Secondary | ICD-10-CM | POA: Diagnosis not present

## 2018-04-06 DIAGNOSIS — M25611 Stiffness of right shoulder, not elsewhere classified: Secondary | ICD-10-CM | POA: Diagnosis not present

## 2018-04-06 DIAGNOSIS — I10 Essential (primary) hypertension: Secondary | ICD-10-CM | POA: Diagnosis not present

## 2018-04-06 DIAGNOSIS — M62511 Muscle wasting and atrophy, not elsewhere classified, right shoulder: Secondary | ICD-10-CM | POA: Diagnosis not present

## 2018-04-06 DIAGNOSIS — M25511 Pain in right shoulder: Secondary | ICD-10-CM | POA: Diagnosis not present

## 2018-04-06 DIAGNOSIS — M2560 Stiffness of unspecified joint, not elsewhere classified: Secondary | ICD-10-CM | POA: Diagnosis not present

## 2018-04-06 DIAGNOSIS — M6281 Muscle weakness (generalized): Secondary | ICD-10-CM | POA: Diagnosis not present

## 2018-04-09 DIAGNOSIS — M2560 Stiffness of unspecified joint, not elsewhere classified: Secondary | ICD-10-CM | POA: Diagnosis not present

## 2018-04-09 DIAGNOSIS — M25611 Stiffness of right shoulder, not elsewhere classified: Secondary | ICD-10-CM | POA: Diagnosis not present

## 2018-04-09 DIAGNOSIS — M6281 Muscle weakness (generalized): Secondary | ICD-10-CM | POA: Diagnosis not present

## 2018-04-09 DIAGNOSIS — M62511 Muscle wasting and atrophy, not elsewhere classified, right shoulder: Secondary | ICD-10-CM | POA: Diagnosis not present

## 2018-04-09 DIAGNOSIS — Z77090 Contact with and (suspected) exposure to asbestos: Secondary | ICD-10-CM | POA: Diagnosis not present

## 2018-04-09 DIAGNOSIS — M25511 Pain in right shoulder: Secondary | ICD-10-CM | POA: Diagnosis not present

## 2018-04-09 DIAGNOSIS — I10 Essential (primary) hypertension: Secondary | ICD-10-CM | POA: Diagnosis not present

## 2018-04-12 DIAGNOSIS — M2560 Stiffness of unspecified joint, not elsewhere classified: Secondary | ICD-10-CM | POA: Diagnosis not present

## 2018-04-12 DIAGNOSIS — M62511 Muscle wasting and atrophy, not elsewhere classified, right shoulder: Secondary | ICD-10-CM | POA: Diagnosis not present

## 2018-04-12 DIAGNOSIS — M6281 Muscle weakness (generalized): Secondary | ICD-10-CM | POA: Diagnosis not present

## 2018-04-12 DIAGNOSIS — I10 Essential (primary) hypertension: Secondary | ICD-10-CM | POA: Diagnosis not present

## 2018-04-12 DIAGNOSIS — Z77090 Contact with and (suspected) exposure to asbestos: Secondary | ICD-10-CM | POA: Diagnosis not present

## 2018-04-12 DIAGNOSIS — M25511 Pain in right shoulder: Secondary | ICD-10-CM | POA: Diagnosis not present

## 2018-04-12 DIAGNOSIS — M25611 Stiffness of right shoulder, not elsewhere classified: Secondary | ICD-10-CM | POA: Diagnosis not present

## 2018-04-17 DIAGNOSIS — R1084 Generalized abdominal pain: Secondary | ICD-10-CM | POA: Diagnosis not present

## 2018-04-17 DIAGNOSIS — K5900 Constipation, unspecified: Secondary | ICD-10-CM | POA: Diagnosis not present

## 2018-04-17 DIAGNOSIS — R109 Unspecified abdominal pain: Secondary | ICD-10-CM | POA: Diagnosis not present

## 2018-04-17 DIAGNOSIS — I10 Essential (primary) hypertension: Secondary | ICD-10-CM | POA: Diagnosis not present

## 2018-04-17 DIAGNOSIS — Z79899 Other long term (current) drug therapy: Secondary | ICD-10-CM | POA: Diagnosis not present

## 2018-04-25 DIAGNOSIS — M25611 Stiffness of right shoulder, not elsewhere classified: Secondary | ICD-10-CM | POA: Diagnosis not present

## 2018-04-25 DIAGNOSIS — Z77090 Contact with and (suspected) exposure to asbestos: Secondary | ICD-10-CM | POA: Diagnosis not present

## 2018-04-25 DIAGNOSIS — M2560 Stiffness of unspecified joint, not elsewhere classified: Secondary | ICD-10-CM | POA: Diagnosis not present

## 2018-04-25 DIAGNOSIS — M25511 Pain in right shoulder: Secondary | ICD-10-CM | POA: Diagnosis not present

## 2018-04-25 DIAGNOSIS — M6281 Muscle weakness (generalized): Secondary | ICD-10-CM | POA: Diagnosis not present

## 2018-04-25 DIAGNOSIS — M62511 Muscle wasting and atrophy, not elsewhere classified, right shoulder: Secondary | ICD-10-CM | POA: Diagnosis not present

## 2018-04-25 DIAGNOSIS — I10 Essential (primary) hypertension: Secondary | ICD-10-CM | POA: Diagnosis not present

## 2018-04-26 ENCOUNTER — Ambulatory Visit (HOSPITAL_COMMUNITY)
Admission: RE | Admit: 2018-04-26 | Discharge: 2018-04-26 | Disposition: A | Discharge status: Home / Self Care | Payer: Medicare Other | Source: Ambulatory Visit | Attending: Urology | Admitting: Urology

## 2018-04-26 DIAGNOSIS — R319 Hematuria, unspecified: Secondary | ICD-10-CM

## 2018-04-26 DIAGNOSIS — N390 Urinary tract infection, site not specified: Secondary | ICD-10-CM | POA: Insufficient documentation

## 2018-04-26 DIAGNOSIS — R310 Gross hematuria: Secondary | ICD-10-CM | POA: Diagnosis not present

## 2018-04-26 LAB — POCT I-STAT CREATININE: CREATININE: 0.9 mg/dL (ref 0.61–1.24)

## 2018-04-26 MED ORDER — IOPAMIDOL (ISOVUE-300) INJECTION 61%
125.0000 mL | Freq: Once | INTRAVENOUS | Status: AC | PRN
  Administered 2018-04-26: 125 mL via INTRAVENOUS

## 2018-04-27 DIAGNOSIS — M2560 Stiffness of unspecified joint, not elsewhere classified: Secondary | ICD-10-CM | POA: Diagnosis not present

## 2018-04-27 DIAGNOSIS — I10 Essential (primary) hypertension: Secondary | ICD-10-CM | POA: Diagnosis not present

## 2018-04-27 DIAGNOSIS — M6281 Muscle weakness (generalized): Secondary | ICD-10-CM | POA: Diagnosis not present

## 2018-04-27 DIAGNOSIS — M25511 Pain in right shoulder: Secondary | ICD-10-CM | POA: Diagnosis not present

## 2018-04-27 DIAGNOSIS — Z77090 Contact with and (suspected) exposure to asbestos: Secondary | ICD-10-CM | POA: Diagnosis not present

## 2018-04-27 DIAGNOSIS — M62511 Muscle wasting and atrophy, not elsewhere classified, right shoulder: Secondary | ICD-10-CM | POA: Diagnosis not present

## 2018-04-27 DIAGNOSIS — M25611 Stiffness of right shoulder, not elsewhere classified: Secondary | ICD-10-CM | POA: Diagnosis not present

## 2018-04-30 DIAGNOSIS — M25611 Stiffness of right shoulder, not elsewhere classified: Secondary | ICD-10-CM | POA: Diagnosis not present

## 2018-04-30 DIAGNOSIS — M62511 Muscle wasting and atrophy, not elsewhere classified, right shoulder: Secondary | ICD-10-CM | POA: Diagnosis not present

## 2018-04-30 DIAGNOSIS — M6281 Muscle weakness (generalized): Secondary | ICD-10-CM | POA: Diagnosis not present

## 2018-04-30 DIAGNOSIS — M7501 Adhesive capsulitis of right shoulder: Secondary | ICD-10-CM | POA: Diagnosis not present

## 2018-04-30 DIAGNOSIS — M2560 Stiffness of unspecified joint, not elsewhere classified: Secondary | ICD-10-CM | POA: Diagnosis not present

## 2018-04-30 DIAGNOSIS — I10 Essential (primary) hypertension: Secondary | ICD-10-CM | POA: Diagnosis not present

## 2018-04-30 DIAGNOSIS — M25511 Pain in right shoulder: Secondary | ICD-10-CM | POA: Diagnosis not present

## 2018-04-30 DIAGNOSIS — Z77090 Contact with and (suspected) exposure to asbestos: Secondary | ICD-10-CM | POA: Diagnosis not present

## 2018-05-02 DIAGNOSIS — I10 Essential (primary) hypertension: Secondary | ICD-10-CM | POA: Diagnosis not present

## 2018-05-02 DIAGNOSIS — M2560 Stiffness of unspecified joint, not elsewhere classified: Secondary | ICD-10-CM | POA: Diagnosis not present

## 2018-05-02 DIAGNOSIS — M62511 Muscle wasting and atrophy, not elsewhere classified, right shoulder: Secondary | ICD-10-CM | POA: Diagnosis not present

## 2018-05-02 DIAGNOSIS — M25511 Pain in right shoulder: Secondary | ICD-10-CM | POA: Diagnosis not present

## 2018-05-02 DIAGNOSIS — Z77090 Contact with and (suspected) exposure to asbestos: Secondary | ICD-10-CM | POA: Diagnosis not present

## 2018-05-02 DIAGNOSIS — M6281 Muscle weakness (generalized): Secondary | ICD-10-CM | POA: Diagnosis not present

## 2018-05-02 DIAGNOSIS — M25611 Stiffness of right shoulder, not elsewhere classified: Secondary | ICD-10-CM | POA: Diagnosis not present

## 2018-05-04 DIAGNOSIS — N390 Urinary tract infection, site not specified: Secondary | ICD-10-CM | POA: Diagnosis not present

## 2018-05-04 DIAGNOSIS — R310 Gross hematuria: Secondary | ICD-10-CM | POA: Diagnosis not present

## 2018-05-04 DIAGNOSIS — R972 Elevated prostate specific antigen [PSA]: Secondary | ICD-10-CM | POA: Diagnosis not present

## 2018-05-07 DIAGNOSIS — I10 Essential (primary) hypertension: Secondary | ICD-10-CM | POA: Diagnosis not present

## 2018-05-07 DIAGNOSIS — M25611 Stiffness of right shoulder, not elsewhere classified: Secondary | ICD-10-CM | POA: Diagnosis not present

## 2018-05-07 DIAGNOSIS — M25511 Pain in right shoulder: Secondary | ICD-10-CM | POA: Diagnosis not present

## 2018-05-07 DIAGNOSIS — M6281 Muscle weakness (generalized): Secondary | ICD-10-CM | POA: Diagnosis not present

## 2018-05-07 DIAGNOSIS — M62511 Muscle wasting and atrophy, not elsewhere classified, right shoulder: Secondary | ICD-10-CM | POA: Diagnosis not present

## 2018-05-07 DIAGNOSIS — Z77090 Contact with and (suspected) exposure to asbestos: Secondary | ICD-10-CM | POA: Diagnosis not present

## 2018-05-07 DIAGNOSIS — M2560 Stiffness of unspecified joint, not elsewhere classified: Secondary | ICD-10-CM | POA: Diagnosis not present

## 2018-05-09 DIAGNOSIS — M62511 Muscle wasting and atrophy, not elsewhere classified, right shoulder: Secondary | ICD-10-CM | POA: Diagnosis not present

## 2018-05-09 DIAGNOSIS — M25611 Stiffness of right shoulder, not elsewhere classified: Secondary | ICD-10-CM | POA: Diagnosis not present

## 2018-05-09 DIAGNOSIS — M25511 Pain in right shoulder: Secondary | ICD-10-CM | POA: Diagnosis not present

## 2018-05-09 DIAGNOSIS — M6281 Muscle weakness (generalized): Secondary | ICD-10-CM | POA: Diagnosis not present

## 2018-05-09 DIAGNOSIS — I10 Essential (primary) hypertension: Secondary | ICD-10-CM | POA: Diagnosis not present

## 2018-05-09 DIAGNOSIS — M2560 Stiffness of unspecified joint, not elsewhere classified: Secondary | ICD-10-CM | POA: Diagnosis not present

## 2018-05-09 DIAGNOSIS — Z77090 Contact with and (suspected) exposure to asbestos: Secondary | ICD-10-CM | POA: Diagnosis not present

## 2018-05-11 ENCOUNTER — Ambulatory Visit: Payer: Medicare Other | Admitting: Urology

## 2018-05-11 ENCOUNTER — Other Ambulatory Visit (HOSPITAL_COMMUNITY)
Admission: RE | Admit: 2018-05-11 | Discharge: 2018-05-11 | Disposition: A | Discharge status: Home / Self Care | Payer: Medicare Other | Source: Other Acute Inpatient Hospital | Attending: Urology | Admitting: Urology

## 2018-05-11 DIAGNOSIS — N390 Urinary tract infection, site not specified: Secondary | ICD-10-CM | POA: Insufficient documentation

## 2018-05-11 DIAGNOSIS — R972 Elevated prostate specific antigen [PSA]: Secondary | ICD-10-CM | POA: Diagnosis not present

## 2018-05-11 DIAGNOSIS — R310 Gross hematuria: Secondary | ICD-10-CM

## 2018-05-11 LAB — URINALYSIS, COMPLETE (UACMP) WITH MICROSCOPIC
Bilirubin Urine: NEGATIVE
GLUCOSE, UA: NEGATIVE mg/dL
Ketones, ur: NEGATIVE mg/dL
NITRITE: POSITIVE — AB
PH: 5 (ref 5.0–8.0)
PROTEIN: 30 mg/dL — AB
Specific Gravity, Urine: 1.017 (ref 1.005–1.030)
WBC, UA: >50 WBC/hpf — ABNORMAL HIGH (ref 0–5)

## 2018-05-13 LAB — URINE CULTURE

## 2018-05-14 DIAGNOSIS — M62511 Muscle wasting and atrophy, not elsewhere classified, right shoulder: Secondary | ICD-10-CM | POA: Diagnosis not present

## 2018-05-14 DIAGNOSIS — I10 Essential (primary) hypertension: Secondary | ICD-10-CM | POA: Diagnosis not present

## 2018-05-14 DIAGNOSIS — M25511 Pain in right shoulder: Secondary | ICD-10-CM | POA: Diagnosis not present

## 2018-05-14 DIAGNOSIS — M2560 Stiffness of unspecified joint, not elsewhere classified: Secondary | ICD-10-CM | POA: Diagnosis not present

## 2018-05-14 DIAGNOSIS — M6281 Muscle weakness (generalized): Secondary | ICD-10-CM | POA: Diagnosis not present

## 2018-05-14 DIAGNOSIS — M25611 Stiffness of right shoulder, not elsewhere classified: Secondary | ICD-10-CM | POA: Diagnosis not present

## 2018-05-14 DIAGNOSIS — Z77090 Contact with and (suspected) exposure to asbestos: Secondary | ICD-10-CM | POA: Diagnosis not present

## 2018-05-16 DIAGNOSIS — I10 Essential (primary) hypertension: Secondary | ICD-10-CM | POA: Diagnosis not present

## 2018-05-16 DIAGNOSIS — M6281 Muscle weakness (generalized): Secondary | ICD-10-CM | POA: Diagnosis not present

## 2018-05-16 DIAGNOSIS — M25511 Pain in right shoulder: Secondary | ICD-10-CM | POA: Diagnosis not present

## 2018-05-16 DIAGNOSIS — M25611 Stiffness of right shoulder, not elsewhere classified: Secondary | ICD-10-CM | POA: Diagnosis not present

## 2018-05-16 DIAGNOSIS — M2560 Stiffness of unspecified joint, not elsewhere classified: Secondary | ICD-10-CM | POA: Diagnosis not present

## 2018-05-16 DIAGNOSIS — Z77090 Contact with and (suspected) exposure to asbestos: Secondary | ICD-10-CM | POA: Diagnosis not present

## 2018-05-16 DIAGNOSIS — M62511 Muscle wasting and atrophy, not elsewhere classified, right shoulder: Secondary | ICD-10-CM | POA: Diagnosis not present

## 2018-05-21 DIAGNOSIS — M2560 Stiffness of unspecified joint, not elsewhere classified: Secondary | ICD-10-CM | POA: Diagnosis not present

## 2018-05-21 DIAGNOSIS — M25511 Pain in right shoulder: Secondary | ICD-10-CM | POA: Diagnosis not present

## 2018-05-21 DIAGNOSIS — Z77090 Contact with and (suspected) exposure to asbestos: Secondary | ICD-10-CM | POA: Diagnosis not present

## 2018-05-21 DIAGNOSIS — M25611 Stiffness of right shoulder, not elsewhere classified: Secondary | ICD-10-CM | POA: Diagnosis not present

## 2018-05-21 DIAGNOSIS — M62511 Muscle wasting and atrophy, not elsewhere classified, right shoulder: Secondary | ICD-10-CM | POA: Diagnosis not present

## 2018-05-21 DIAGNOSIS — I10 Essential (primary) hypertension: Secondary | ICD-10-CM | POA: Diagnosis not present

## 2018-05-21 DIAGNOSIS — M6281 Muscle weakness (generalized): Secondary | ICD-10-CM | POA: Diagnosis not present

## 2018-05-23 DIAGNOSIS — I10 Essential (primary) hypertension: Secondary | ICD-10-CM | POA: Diagnosis not present

## 2018-05-23 DIAGNOSIS — M25611 Stiffness of right shoulder, not elsewhere classified: Secondary | ICD-10-CM | POA: Diagnosis not present

## 2018-05-23 DIAGNOSIS — M62511 Muscle wasting and atrophy, not elsewhere classified, right shoulder: Secondary | ICD-10-CM | POA: Diagnosis not present

## 2018-05-23 DIAGNOSIS — M2560 Stiffness of unspecified joint, not elsewhere classified: Secondary | ICD-10-CM | POA: Diagnosis not present

## 2018-05-23 DIAGNOSIS — M6281 Muscle weakness (generalized): Secondary | ICD-10-CM | POA: Diagnosis not present

## 2018-05-23 DIAGNOSIS — Z77090 Contact with and (suspected) exposure to asbestos: Secondary | ICD-10-CM | POA: Diagnosis not present

## 2018-05-23 DIAGNOSIS — M25511 Pain in right shoulder: Secondary | ICD-10-CM | POA: Diagnosis not present

## 2018-05-29 DIAGNOSIS — M7501 Adhesive capsulitis of right shoulder: Secondary | ICD-10-CM | POA: Diagnosis not present

## 2018-05-29 DIAGNOSIS — I10 Essential (primary) hypertension: Secondary | ICD-10-CM | POA: Diagnosis not present

## 2018-05-29 DIAGNOSIS — R7301 Impaired fasting glucose: Secondary | ICD-10-CM | POA: Diagnosis not present

## 2018-06-04 DIAGNOSIS — I10 Essential (primary) hypertension: Secondary | ICD-10-CM | POA: Diagnosis not present

## 2018-06-04 DIAGNOSIS — M25611 Stiffness of right shoulder, not elsewhere classified: Secondary | ICD-10-CM | POA: Diagnosis not present

## 2018-06-04 DIAGNOSIS — M62511 Muscle wasting and atrophy, not elsewhere classified, right shoulder: Secondary | ICD-10-CM | POA: Diagnosis not present

## 2018-06-04 DIAGNOSIS — M25511 Pain in right shoulder: Secondary | ICD-10-CM | POA: Diagnosis not present

## 2018-06-04 DIAGNOSIS — Z77090 Contact with and (suspected) exposure to asbestos: Secondary | ICD-10-CM | POA: Diagnosis not present

## 2018-06-06 DIAGNOSIS — M62511 Muscle wasting and atrophy, not elsewhere classified, right shoulder: Secondary | ICD-10-CM | POA: Diagnosis not present

## 2018-06-06 DIAGNOSIS — M25611 Stiffness of right shoulder, not elsewhere classified: Secondary | ICD-10-CM | POA: Diagnosis not present

## 2018-06-06 DIAGNOSIS — I10 Essential (primary) hypertension: Secondary | ICD-10-CM | POA: Diagnosis not present

## 2018-06-06 DIAGNOSIS — Z77090 Contact with and (suspected) exposure to asbestos: Secondary | ICD-10-CM | POA: Diagnosis not present

## 2018-06-06 DIAGNOSIS — M25511 Pain in right shoulder: Secondary | ICD-10-CM | POA: Diagnosis not present

## 2018-06-11 DIAGNOSIS — I10 Essential (primary) hypertension: Secondary | ICD-10-CM | POA: Diagnosis not present

## 2018-06-11 DIAGNOSIS — Z77090 Contact with and (suspected) exposure to asbestos: Secondary | ICD-10-CM | POA: Diagnosis not present

## 2018-06-11 DIAGNOSIS — M62511 Muscle wasting and atrophy, not elsewhere classified, right shoulder: Secondary | ICD-10-CM | POA: Diagnosis not present

## 2018-06-11 DIAGNOSIS — M25511 Pain in right shoulder: Secondary | ICD-10-CM | POA: Diagnosis not present

## 2018-06-11 DIAGNOSIS — M25611 Stiffness of right shoulder, not elsewhere classified: Secondary | ICD-10-CM | POA: Diagnosis not present

## 2018-06-13 DIAGNOSIS — I10 Essential (primary) hypertension: Secondary | ICD-10-CM | POA: Diagnosis not present

## 2018-06-13 DIAGNOSIS — M62511 Muscle wasting and atrophy, not elsewhere classified, right shoulder: Secondary | ICD-10-CM | POA: Diagnosis not present

## 2018-06-13 DIAGNOSIS — M25511 Pain in right shoulder: Secondary | ICD-10-CM | POA: Diagnosis not present

## 2018-06-13 DIAGNOSIS — Z77090 Contact with and (suspected) exposure to asbestos: Secondary | ICD-10-CM | POA: Diagnosis not present

## 2018-06-13 DIAGNOSIS — M25611 Stiffness of right shoulder, not elsewhere classified: Secondary | ICD-10-CM | POA: Diagnosis not present

## 2018-06-22 ENCOUNTER — Ambulatory Visit: Payer: Medicare Other | Admitting: Urology

## 2018-06-22 DIAGNOSIS — R972 Elevated prostate specific antigen [PSA]: Secondary | ICD-10-CM

## 2018-06-22 DIAGNOSIS — N390 Urinary tract infection, site not specified: Secondary | ICD-10-CM | POA: Diagnosis not present

## 2018-06-22 DIAGNOSIS — R310 Gross hematuria: Secondary | ICD-10-CM | POA: Diagnosis not present

## 2018-07-03 DIAGNOSIS — M7501 Adhesive capsulitis of right shoulder: Secondary | ICD-10-CM | POA: Diagnosis not present

## 2018-09-18 DIAGNOSIS — L821 Other seborrheic keratosis: Secondary | ICD-10-CM | POA: Diagnosis not present

## 2018-09-18 DIAGNOSIS — Z85828 Personal history of other malignant neoplasm of skin: Secondary | ICD-10-CM | POA: Diagnosis not present

## 2018-09-18 DIAGNOSIS — L570 Actinic keratosis: Secondary | ICD-10-CM | POA: Diagnosis not present

## 2018-09-21 DIAGNOSIS — D72829 Elevated white blood cell count, unspecified: Secondary | ICD-10-CM | POA: Diagnosis not present

## 2018-09-21 DIAGNOSIS — I10 Essential (primary) hypertension: Secondary | ICD-10-CM | POA: Diagnosis not present

## 2018-09-21 DIAGNOSIS — K219 Gastro-esophageal reflux disease without esophagitis: Secondary | ICD-10-CM | POA: Diagnosis not present

## 2018-09-21 DIAGNOSIS — E782 Mixed hyperlipidemia: Secondary | ICD-10-CM | POA: Diagnosis not present

## 2018-09-21 DIAGNOSIS — R7301 Impaired fasting glucose: Secondary | ICD-10-CM | POA: Diagnosis not present

## 2018-09-24 DIAGNOSIS — Z0001 Encounter for general adult medical examination with abnormal findings: Secondary | ICD-10-CM | POA: Diagnosis not present

## 2018-09-24 DIAGNOSIS — I10 Essential (primary) hypertension: Secondary | ICD-10-CM | POA: Diagnosis not present

## 2018-09-28 ENCOUNTER — Ambulatory Visit: Payer: Medicare Other | Admitting: Urology

## 2018-09-28 DIAGNOSIS — Z87440 Personal history of urinary (tract) infections: Secondary | ICD-10-CM | POA: Diagnosis not present

## 2018-09-28 DIAGNOSIS — R972 Elevated prostate specific antigen [PSA]: Secondary | ICD-10-CM

## 2019-03-06 DIAGNOSIS — L821 Other seborrheic keratosis: Secondary | ICD-10-CM | POA: Diagnosis not present

## 2019-03-06 DIAGNOSIS — E782 Mixed hyperlipidemia: Secondary | ICD-10-CM | POA: Diagnosis not present

## 2019-03-06 DIAGNOSIS — D2271 Melanocytic nevi of right lower limb, including hip: Secondary | ICD-10-CM | POA: Diagnosis not present

## 2019-03-06 DIAGNOSIS — D225 Melanocytic nevi of trunk: Secondary | ICD-10-CM | POA: Diagnosis not present

## 2019-03-06 DIAGNOSIS — Z85828 Personal history of other malignant neoplasm of skin: Secondary | ICD-10-CM | POA: Diagnosis not present

## 2019-03-06 DIAGNOSIS — I10 Essential (primary) hypertension: Secondary | ICD-10-CM | POA: Diagnosis not present

## 2019-03-06 DIAGNOSIS — L570 Actinic keratosis: Secondary | ICD-10-CM | POA: Diagnosis not present

## 2019-03-06 DIAGNOSIS — C44319 Basal cell carcinoma of skin of other parts of face: Secondary | ICD-10-CM | POA: Diagnosis not present

## 2019-03-06 DIAGNOSIS — R600 Localized edema: Secondary | ICD-10-CM | POA: Diagnosis not present

## 2019-03-06 DIAGNOSIS — D485 Neoplasm of uncertain behavior of skin: Secondary | ICD-10-CM | POA: Diagnosis not present

## 2019-03-06 DIAGNOSIS — R7301 Impaired fasting glucose: Secondary | ICD-10-CM | POA: Diagnosis not present

## 2019-03-06 DIAGNOSIS — H6121 Impacted cerumen, right ear: Secondary | ICD-10-CM | POA: Diagnosis not present

## 2019-03-21 DIAGNOSIS — C44319 Basal cell carcinoma of skin of other parts of face: Secondary | ICD-10-CM | POA: Diagnosis not present

## 2019-04-23 DIAGNOSIS — K219 Gastro-esophageal reflux disease without esophagitis: Secondary | ICD-10-CM | POA: Diagnosis not present

## 2019-04-23 DIAGNOSIS — E559 Vitamin D deficiency, unspecified: Secondary | ICD-10-CM | POA: Diagnosis not present

## 2019-04-23 DIAGNOSIS — E782 Mixed hyperlipidemia: Secondary | ICD-10-CM | POA: Diagnosis not present

## 2019-04-23 DIAGNOSIS — R7301 Impaired fasting glucose: Secondary | ICD-10-CM | POA: Diagnosis not present

## 2019-04-23 DIAGNOSIS — I10 Essential (primary) hypertension: Secondary | ICD-10-CM | POA: Diagnosis not present

## 2019-04-25 DIAGNOSIS — R600 Localized edema: Secondary | ICD-10-CM | POA: Diagnosis not present

## 2019-04-25 DIAGNOSIS — I10 Essential (primary) hypertension: Secondary | ICD-10-CM | POA: Diagnosis not present

## 2019-04-25 DIAGNOSIS — E782 Mixed hyperlipidemia: Secondary | ICD-10-CM | POA: Diagnosis not present

## 2019-04-25 DIAGNOSIS — R7301 Impaired fasting glucose: Secondary | ICD-10-CM | POA: Diagnosis not present

## 2019-09-04 DIAGNOSIS — L570 Actinic keratosis: Secondary | ICD-10-CM | POA: Diagnosis not present

## 2019-09-04 DIAGNOSIS — Z85820 Personal history of malignant melanoma of skin: Secondary | ICD-10-CM | POA: Diagnosis not present

## 2019-09-04 DIAGNOSIS — L821 Other seborrheic keratosis: Secondary | ICD-10-CM | POA: Diagnosis not present

## 2019-10-04 ENCOUNTER — Other Ambulatory Visit (HOSPITAL_COMMUNITY)
Admission: RE | Admit: 2019-10-04 | Discharge: 2019-10-04 | Disposition: A | Discharge status: Home / Self Care | Payer: Medicare Other | Source: Ambulatory Visit | Attending: Urology | Admitting: Urology

## 2019-10-04 ENCOUNTER — Ambulatory Visit (INDEPENDENT_AMBULATORY_CARE_PROVIDER_SITE_OTHER): Payer: Medicare Other | Admitting: Urology

## 2019-10-04 DIAGNOSIS — N390 Urinary tract infection, site not specified: Secondary | ICD-10-CM

## 2019-10-04 DIAGNOSIS — R972 Elevated prostate specific antigen [PSA]: Secondary | ICD-10-CM

## 2019-10-04 DIAGNOSIS — N400 Enlarged prostate without lower urinary tract symptoms: Secondary | ICD-10-CM

## 2019-10-04 DIAGNOSIS — R310 Gross hematuria: Secondary | ICD-10-CM

## 2019-10-06 LAB — URINE CULTURE: Culture: >=100000 — AB

## 2019-10-21 DIAGNOSIS — E559 Vitamin D deficiency, unspecified: Secondary | ICD-10-CM | POA: Diagnosis not present

## 2019-10-21 DIAGNOSIS — R7301 Impaired fasting glucose: Secondary | ICD-10-CM | POA: Diagnosis not present

## 2019-10-21 DIAGNOSIS — I10 Essential (primary) hypertension: Secondary | ICD-10-CM | POA: Diagnosis not present

## 2019-10-21 DIAGNOSIS — E782 Mixed hyperlipidemia: Secondary | ICD-10-CM | POA: Diagnosis not present

## 2019-10-21 DIAGNOSIS — K219 Gastro-esophageal reflux disease without esophagitis: Secondary | ICD-10-CM | POA: Diagnosis not present

## 2019-10-25 DIAGNOSIS — Z0001 Encounter for general adult medical examination with abnormal findings: Secondary | ICD-10-CM | POA: Diagnosis not present

## 2019-10-25 DIAGNOSIS — D72829 Elevated white blood cell count, unspecified: Secondary | ICD-10-CM | POA: Diagnosis not present

## 2019-10-25 DIAGNOSIS — Z23 Encounter for immunization: Secondary | ICD-10-CM | POA: Diagnosis not present

## 2019-10-25 DIAGNOSIS — I10 Essential (primary) hypertension: Secondary | ICD-10-CM | POA: Diagnosis not present

## 2019-10-25 DIAGNOSIS — R7301 Impaired fasting glucose: Secondary | ICD-10-CM | POA: Diagnosis not present

## 2019-10-28 DIAGNOSIS — I10 Essential (primary) hypertension: Secondary | ICD-10-CM | POA: Diagnosis not present

## 2019-10-28 DIAGNOSIS — E782 Mixed hyperlipidemia: Secondary | ICD-10-CM | POA: Diagnosis not present

## 2019-11-01 ENCOUNTER — Ambulatory Visit (INDEPENDENT_AMBULATORY_CARE_PROVIDER_SITE_OTHER): Payer: Medicare Other | Admitting: Urology

## 2019-11-01 ENCOUNTER — Other Ambulatory Visit (HOSPITAL_COMMUNITY)
Admission: RE | Admit: 2019-11-01 | Discharge: 2019-11-01 | Disposition: A | Discharge status: Home / Self Care | Payer: Medicare Other | Source: Ambulatory Visit | Attending: Urology | Admitting: Urology

## 2019-11-01 ENCOUNTER — Other Ambulatory Visit: Payer: Self-pay

## 2019-11-01 DIAGNOSIS — N390 Urinary tract infection, site not specified: Secondary | ICD-10-CM | POA: Insufficient documentation

## 2019-11-01 LAB — URINALYSIS, COMPLETE (UACMP) WITH MICROSCOPIC
Bacteria, UA: NONE SEEN
Bilirubin Urine: NEGATIVE
Glucose, UA: NEGATIVE mg/dL
Hgb urine dipstick: NEGATIVE
Ketones, ur: NEGATIVE mg/dL
Leukocytes,Ua: NEGATIVE
Nitrite: NEGATIVE
Protein, ur: NEGATIVE mg/dL
Specific Gravity, Urine: 1.01 (ref 1.005–1.030)
pH: 7 (ref 5.0–8.0)

## 2019-11-02 LAB — URINE CULTURE: Culture: NO GROWTH

## 2019-11-28 DIAGNOSIS — I10 Essential (primary) hypertension: Secondary | ICD-10-CM | POA: Diagnosis not present

## 2019-11-28 DIAGNOSIS — E782 Mixed hyperlipidemia: Secondary | ICD-10-CM | POA: Diagnosis not present

## 2019-12-27 DIAGNOSIS — E7849 Other hyperlipidemia: Secondary | ICD-10-CM | POA: Diagnosis not present

## 2019-12-27 DIAGNOSIS — I10 Essential (primary) hypertension: Secondary | ICD-10-CM | POA: Diagnosis not present

## 2020-01-03 ENCOUNTER — Ambulatory Visit (INDEPENDENT_AMBULATORY_CARE_PROVIDER_SITE_OTHER): Payer: Medicare Other | Admitting: Urology

## 2020-01-03 ENCOUNTER — Encounter: Payer: Self-pay | Admitting: Urology

## 2020-01-03 ENCOUNTER — Other Ambulatory Visit (HOSPITAL_COMMUNITY)
Admission: AD | Admit: 2020-01-03 | Discharge: 2020-01-03 | Disposition: A | Discharge status: Home / Self Care | Payer: Medicare Other | Source: Other Acute Inpatient Hospital | Attending: Urology | Admitting: Urology

## 2020-01-03 ENCOUNTER — Other Ambulatory Visit: Payer: Self-pay

## 2020-01-03 VITALS — BP 139/76 | HR 70 | Temp 97.2°F | Ht 74.0 in | Wt 298.0 lb

## 2020-01-03 DIAGNOSIS — R310 Gross hematuria: Secondary | ICD-10-CM | POA: Insufficient documentation

## 2020-01-03 DIAGNOSIS — R972 Elevated prostate specific antigen [PSA]: Secondary | ICD-10-CM | POA: Diagnosis not present

## 2020-01-03 DIAGNOSIS — N400 Enlarged prostate without lower urinary tract symptoms: Secondary | ICD-10-CM | POA: Diagnosis not present

## 2020-01-03 DIAGNOSIS — Z87440 Personal history of urinary (tract) infections: Secondary | ICD-10-CM | POA: Insufficient documentation

## 2020-01-03 LAB — POCT URINALYSIS DIPSTICK
Bilirubin, UA: NEGATIVE
Glucose, UA: NEGATIVE
Ketones, UA: NEGATIVE
Nitrite, UA: NEGATIVE
Protein, UA: POSITIVE — AB
Spec Grav, UA: 1.02 (ref 1.010–1.025)
Urobilinogen, UA: NEGATIVE E.U./dL — AB
pH, UA: 6 (ref 5.0–8.0)

## 2020-01-03 MED ORDER — NITROFURANTOIN MONOHYD MACRO 100 MG PO CAPS: 100.0000 mg | ORAL_CAPSULE | Freq: Two times a day (BID) | ORAL | 0 refills | Status: DC

## 2020-01-03 NOTE — Progress Notes (Signed)
Subjective:  1. Gross hematuria   2. Elevated PSA   3. Personal history of urinary (tract) infections   4. Benign prostatic hyperplasia without lower urinary tract symptoms       Mr. Belgarde has a  history of UTI's, an elevated PSA and hematuria.   He returns now with a 7 day history of intermittent hematuria with clot passage.  He has no dysuria.  He has not been on an antibiotic since his last visit.  He doesn't have malodorous urine.  His IPSSis 5 with nocturia x 2.      Mr. Kollars returns today in f/u.  He had an e. Coli UTI with hematuria at his last visit.  A repeat UA and culture on 11/01/19 were negative.   His last PSA was 7.9 last fall which is reasonably stable over the last few years. He has a history of UTI's and an elevated PSA. He is no longer on macrobid for suppression. He has no dysuria. He has had intermittent hematuria that is long standing and he has an unusual sensation at the end of the stream. His UA today looks infected. His IPSS is 2.     GU Hx: Mr. Everage is sent in consultation by Dr. Pleas Koch for an elevated PSA of 9.7 in 10/18 which was up from 7.2 in 5/18. He had a negative biopsy in 8/16 by Dr. Exie Parody for a PSA os 10.2 in 3/16. He had a subsequent prostate MRI in 3/17 that showed a 6x16mm lesion at the right lateral base that could represent low grade cancer. He had another biopsy in 4/17 that was negative. He has had 2 prior TURPs with the last in 2014. He has had the bleeding intermittently since then but had a cystoscopy in 2017. He has had a history of UTI's prior to the TURP but no symptomatic infections since. He has no other associated signs or symptoms.      ROS:  ROS:  A complete review of systems was performed.  All systems are negative except for pertinent findings as noted.   Review of Systems  All other systems reviewed and are negative.  IPSS    Row Name 01/03/20 0800         International Prostate Symptom Score   How often have you  had the sensation of not emptying your bladder?  Not at All     How often have you had to urinate less than every two hours?  Less than 1 in 5 times     How often have you found you stopped and started again several times when you urinated?  Less than 1 in 5 times     How often have you found it difficult to postpone urination?  Less than 1 in 5 times     How often have you had a weak urinary stream?  Not at All     How often have you had to strain to start urination?  Not at All     How many times did you typically get up at night to urinate?  2 Times     Total IPSS Score  5       Quality of Life due to urinary symptoms   If you were to spend the rest of your life with your urinary condition just the way it is now how would you feel about that?  Pleased         Not on File  Outpatient Encounter Medications  as of 01/03/2020  Medication Sig  . atorvastatin (LIPITOR) 20 MG tablet Take 20 mg by mouth daily.  . hydrochlorothiazide (HYDRODIURIL) 12.5 MG tablet Take 12.5 mg by mouth daily.  Marland Kitchen losartan (COZAAR) 100 MG tablet Take 100 mg by mouth daily.  . nitrofurantoin, macrocrystal-monohydrate, (MACROBID) 100 MG capsule Take 1 capsule (100 mg total) by mouth every 12 (twelve) hours.  Marland Kitchen omeprazole (PRILOSEC) 40 MG capsule Take 40 mg by mouth daily.   No facility-administered encounter medications on file as of 01/03/2020.    Past Medical History:  Diagnosis Date  . Anxiety   . Asbestosis ([place])   . GERD (gastroesophageal reflux disease)   . Hypercholesteremia   . Hypertension     Past Surgical History:  Procedure Laterality Date  . PROSTATE BIOPSY  02/2016  . PROSTATE BIOPSY  06/2015  . TRANSURETHRAL RESECTION OF PROSTATE  2014   He had one prior as well  . VIDEO ASSISTED THORACOSCOPY (VATS)/DECORTICATION Left 2006    Social History   Socioeconomic History  . Marital status: Married    Spouse name: Not on file  . Number of children: Not on file  . Years of education: Not on  file  . Highest education level: Not on file  Occupational History  . Not on file  Tobacco Use  . Smoking status: Never Smoker  . Smokeless tobacco: Never Used  Substance and Sexual Activity  . Alcohol use: Never  . Drug use: Not on file  . Sexual activity: Not on file  Other Topics Concern  . Not on file  Social History Narrative  . Not on file   Social Determinants of Health   Financial Resource Strain:   . Difficulty of Paying Living Expenses: Not on file  Food Insecurity:   . Worried About Charity fundraiser in the Last Year: Not on file  . Ran Out of Food in the Last Year: Not on file  Transportation Needs:   . Lack of Transportation (Medical): Not on file  . Lack of Transportation (Non-Medical): Not on file  Physical Activity:   . Days of Exercise per Week: Not on file  . Minutes of Exercise per Session: Not on file  Stress:   . Feeling of Stress : Not on file  Social Connections:   . Frequency of Communication with Friends and Family: Not on file  . Frequency of Social Gatherings with Friends and Family: Not on file  . Attends Religious Services: Not on file  . Active Member of Clubs or Organizations: Not on file  . Attends Archivist Meetings: Not on file  . Marital Status: Not on file  Intimate Partner Violence:   . Fear of Current or Ex-Partner: Not on file  . Emotionally Abused: Not on file  . Physically Abused: Not on file  . Sexually Abused: Not on file    Family History  Problem Relation Age of Onset  . Nephrolithiasis Father        Objective: Vitals:   01/03/20 0834  BP: 139/76  Pulse: 70  Temp: (!) 97.2 F (36.2 C)     Physical Exam  Lab Results:   His urine culture on 11/01/19 was negative.    Results for orders placed or performed in visit on 01/03/20 (from the past 24 hour(s))  POCT urinalysis dipstick     Status: Abnormal   Collection Time: 01/03/20  8:40 AM  Result Value Ref Range   Color, UA yellow  Clarity, UA      Glucose, UA Negative Negative   Bilirubin, UA neg    Ketones, UA neg    Spec Grav, UA 1.020 1.010 - 1.025   Blood, UA +    pH, UA 6.0 5.0 - 8.0   Protein, UA Positive (A) Negative   Urobilinogen, UA negative (A) 0.2 or 1.0 E.U./dL   Nitrite, UA neg    Leukocytes, UA Moderate (2+) (A) Negative   Appearance hazy    Odor         Studies/Results: No results found.    Assessment & Plan: He has gross hematuria with probable  recurrent hemorrhagic cystitis.  I will get a culture and begin macrobid pending that result.  I will resume suppression based on that result and have him return in 3 months.    Elevated PSA.  I will repeat a PSA prior to f/u.     Meds ordered this encounter  Medications  . nitrofurantoin, macrocrystal-monohydrate, (MACROBID) 100 MG capsule    Sig: Take 1 capsule (100 mg total) by mouth every 12 (twelve) hours.    Dispense:  14 capsule    Refill:  0     Orders Placed This Encounter  Procedures  . Urine Culture    Standing Status:   Future    Standing Expiration Date:   01/31/2020  . PSA, total and free    Standing Status:   Future    Standing Expiration Date:   05/02/2020  . POCT urinalysis dipstick      Return in about 6 weeks (around 02/14/2020) for f/u of UTI's. .   CC: [name], Virgina Evener, MD      Irine Seal 01/03/2020

## 2020-01-05 LAB — URINE CULTURE: Culture: 50000 — AB

## 2020-01-09 ENCOUNTER — Telehealth: Payer: Self-pay

## 2020-01-09 DIAGNOSIS — R310 Gross hematuria: Secondary | ICD-10-CM

## 2020-01-09 DIAGNOSIS — Z87440 Personal history of urinary (tract) infections: Secondary | ICD-10-CM

## 2020-01-09 NOTE — Telephone Encounter (Signed)
-----   Message from Irine Seal, MD sent at 01/08/2020  2:22 PM EST ----- E. Coli on culture.   Complete macrobid.   He needs a repeat UA/culture in about 3 weeks.  ----- Message ----- From: Dorisann Frames, RN Sent: 01/08/2020  10:01 AM EST To: Irine Seal, MD  Urine culture positive

## 2020-01-09 NOTE — Telephone Encounter (Signed)
Pt notified. appt scheduled for urine drop off in 3 weeks. Pt has not picked up prescription yet. Notified pt rx was sent on 2/5. Pt will go get his prescription.

## 2020-01-24 DIAGNOSIS — I10 Essential (primary) hypertension: Secondary | ICD-10-CM | POA: Diagnosis not present

## 2020-01-24 DIAGNOSIS — E7849 Other hyperlipidemia: Secondary | ICD-10-CM | POA: Diagnosis not present

## 2020-01-31 ENCOUNTER — Ambulatory Visit: Payer: Medicare Other

## 2020-01-31 ENCOUNTER — Other Ambulatory Visit: Payer: Self-pay

## 2020-01-31 ENCOUNTER — Other Ambulatory Visit (HOSPITAL_COMMUNITY)
Admission: AD | Admit: 2020-01-31 | Discharge: 2020-01-31 | Disposition: A | Discharge status: Home / Self Care | Payer: Medicare Other | Source: Other Acute Inpatient Hospital | Attending: Urology | Admitting: Urology

## 2020-01-31 DIAGNOSIS — R310 Gross hematuria: Secondary | ICD-10-CM | POA: Diagnosis not present

## 2020-01-31 LAB — URINALYSIS, ROUTINE W REFLEX MICROSCOPIC
Bacteria, UA: NONE SEEN
Bilirubin Urine: NEGATIVE
Glucose, UA: NEGATIVE mg/dL
Hgb urine dipstick: NEGATIVE
Ketones, ur: NEGATIVE mg/dL
Leukocytes,Ua: NEGATIVE
Nitrite: NEGATIVE
Protein, ur: 30 mg/dL — AB
Specific Gravity, Urine: 1.021 (ref 1.005–1.030)
pH: 6 (ref 5.0–8.0)

## 2020-01-31 NOTE — Progress Notes (Signed)
Pt. came and urine sample collected and sent to lab.

## 2020-02-02 LAB — URINE CULTURE: Culture: NO GROWTH

## 2020-02-05 ENCOUNTER — Telehealth: Payer: Self-pay

## 2020-02-05 NOTE — Telephone Encounter (Signed)
Lft. msg with pt to call back.

## 2020-02-07 NOTE — Telephone Encounter (Signed)
-----   Message from Irine Seal, MD sent at 02/05/2020  8:26 AM EST ----- Urine culture is negative.  ----- Message ----- From: Valentina Lucks, LPN Sent: [id number]  075-GRM PM EST To: Irine Seal, MD  UA

## 2020-02-07 NOTE — Telephone Encounter (Signed)
Pt. Called back and [employer] gave results.

## 2020-02-26 DIAGNOSIS — I10 Essential (primary) hypertension: Secondary | ICD-10-CM | POA: Diagnosis not present

## 2020-02-26 DIAGNOSIS — E7849 Other hyperlipidemia: Secondary | ICD-10-CM | POA: Diagnosis not present

## 2020-03-12 NOTE — Progress Notes (Signed)
Subjective:  1. Benign prostatic hyperplasia without lower urinary tract symptoms   2. Personal history of urinary infection   3. Elevated PSA       [name] has a  history of UTI's, an elevated PSA and hematuria.   He last had a UA and culture on 01/31/20 and they were unremarkable.  He has previously had e. Coli on most of his cultures.  His UA has LE 2+ today.     His last PSA was 7.9 in the fall of 2020 which is reasonably stable over the last few years. He has a history of UTI's and an elevated PSA. He is no longer on macrobid for suppression. He has no dysuria. He has had intermittent hematuria that is long standing and he has an unusual sensation at the end of the stream.  He has had some intermittent terminal dysuria and occasional hematuria.      GU Hx: Benjamin Colon is sent in consultation by Dr. Pleas Koch for an elevated PSA of 9.7 in 10/18 which was up from 7.2 in 5/18. He had a negative biopsy in 8/16 by Dr. Exie Parody for a PSA os 10.2 in 3/16. He had a subsequent prostate MRI in 3/17 that showed a 6x58mm lesion at the right lateral base that could represent low grade cancer. He had another biopsy in 4/17 that was negative. He has had 2 prior TURPs with the last in 2014. He has had the bleeding intermittently since then but had a cystoscopy in 2017. He has had a history of UTI's prior to the TURP but no symptomatic infections since. He has no other associated signs or symptoms.      ROS:  ROS:  A complete review of systems was performed.  All systems are negative except for pertinent findings as noted.   Review of Systems  All other systems reviewed and are negative.  IPSS    Row Name 03/13/20 1000         International Prostate Symptom Score   How often have you had the sensation of not emptying your bladder?  Not at All     How often have you had to urinate less than every two hours?  Not at All     How often have you found you stopped and started again several times when  you urinated?  Less than 1 in 5 times     How often have you found it difficult to postpone urination?  Not at All     How often have you had a weak urinary stream?  Not at All     How often have you had to strain to start urination?  Not at All     How many times did you typically get up at night to urinate?  2 Times     Total IPSS Score  3       Quality of Life due to urinary symptoms   If you were to spend the rest of your life with your urinary condition just the way it is now how would you feel about that?  Mixed         Not on File  Outpatient Encounter Medications as of 03/13/2020  Medication Sig  . atorvastatin (LIPITOR) 20 MG tablet Take 20 mg by mouth daily.  . hydrochlorothiazide (HYDRODIURIL) 12.5 MG tablet Take 12.5 mg by mouth daily.  Marland Kitchen losartan (COZAAR) 100 MG tablet Take 100 mg by mouth daily.  Marland Kitchen omeprazole (PRILOSEC) 40 MG  capsule Take 40 mg by mouth daily.  . nitrofurantoin, macrocrystal-monohydrate, (MACROBID) 100 MG capsule Take 1 capsule (100 mg total) by mouth every 12 (twelve) hours. (Patient not taking: Reported on 03/13/2020)   No facility-administered encounter medications on file as of 03/13/2020.    Past Medical History:  Diagnosis Date  . Anxiety   . Asbestosis ([place])   . GERD (gastroesophageal reflux disease)   . Hypercholesteremia   . Hypertension     Past Surgical History:  Procedure Laterality Date  . PROSTATE BIOPSY  02/2016  . PROSTATE BIOPSY  06/2015  . TRANSURETHRAL RESECTION OF PROSTATE  2014   He had one prior as well  . VIDEO ASSISTED THORACOSCOPY (VATS)/DECORTICATION Left 2006    Social History   Socioeconomic History  . Marital status: Married    Spouse name: Not on file  . Number of children: Not on file  . Years of education: Not on file  . Highest education level: Not on file  Occupational History  . Not on file  Tobacco Use  . Smoking status: Never Smoker  . Smokeless tobacco: Never Used  Substance and Sexual Activity   . Alcohol use: Never  . Drug use: Not on file  . Sexual activity: Not on file  Other Topics Concern  . Not on file  Social History Narrative  . Not on file   Social Determinants of Health   Financial Resource Strain:   . Difficulty of Paying Living Expenses:   Food Insecurity:   . Worried About Charity fundraiser in the Last Year:   . Arboriculturist in the Last Year:   Transportation Needs:   . Film/video editor (Medical):   Marland Kitchen Lack of Transportation (Non-Medical):   Physical Activity:   . Days of Exercise per Week:   . Minutes of Exercise per Session:   Stress:   . Feeling of Stress :   Social Connections:   . Frequency of Communication with Friends and Family:   . Frequency of Social Gatherings with Friends and Family:   . Attends Religious Services:   . Active Member of Clubs or Organizations:   . Attends Archivist Meetings:   Marland Kitchen Marital Status:   Intimate Partner Violence:   . Fear of Current or Ex-Partner:   . Emotionally Abused:   Marland Kitchen Physically Abused:   . Sexually Abused:     Family History  Problem Relation Age of Onset  . Nephrolithiasis Father        Objective: Vitals:   03/13/20 1010  BP: (!) 142/74  Pulse: 76  Temp: 98.1 F (36.7 C)     Physical Exam Vitals reviewed.  Constitutional:      Appearance: Normal appearance.  Neurological:     Mental Status: He is alert.     Lab Results:   His urine culture on 11/01/19 was negative.    Results for orders placed or performed in visit on 03/13/20 (from the past 24 hour(s))  POCT urinalysis dipstick     Status: Abnormal   Collection Time: 03/13/20 10:30 AM  Result Value Ref Range   Color, UA lt yellow    Clarity, UA cloudy    Glucose, UA Negative Negative   Bilirubin, UA small    Ketones, UA neg    Spec Grav, UA 1.020 1.010 - 1.025   Blood, UA large    pH, UA 6.0 5.0 - 8.0   Protein, UA Positive (A) Negative  Urobilinogen, UA negative (A) 0.2 or 1.0 E.U./dL    Nitrite, UA positive    Leukocytes, UA Moderate (2+) (A) Negative   Appearance cloudy    Odor         Studies/Results: No results found.    Assessment & Plan: He has intermittent gross hematuria with some terminal dysuria and his UA looks infected again today.  I will get a culture and consider resuming  suppression based on that result and have him return in 6 months.    Elevated PSA.  Repeat PSA in 6 months.    No orders of the defined types were placed in this encounter.    Orders Placed This Encounter  Procedures  . Urine Culture    Standing Status:   Future    Standing Expiration Date:   03/13/2021  . Urinalysis, Routine w reflex microscopic    Standing Status:   Future    Standing Expiration Date:   03/13/2021  . PSA, total and free    Standing Status:   Future    Standing Expiration Date:   03/13/2021  . POCT urinalysis dipstick      Return in about 6 months (around 09/12/2020) for with PSA.   CC: [name], Virgina Evener, MD      Irine Seal 03/13/2020

## 2020-03-13 ENCOUNTER — Encounter: Payer: Self-pay | Admitting: Urology

## 2020-03-13 ENCOUNTER — Ambulatory Visit (INDEPENDENT_AMBULATORY_CARE_PROVIDER_SITE_OTHER): Payer: Medicare Other | Admitting: Urology

## 2020-03-13 ENCOUNTER — Other Ambulatory Visit (HOSPITAL_COMMUNITY)
Admission: AD | Admit: 2020-03-13 | Discharge: 2020-03-13 | Disposition: A | Discharge status: Home / Self Care | Payer: Medicare Other | Source: Other Acute Inpatient Hospital | Attending: Urology | Admitting: Urology

## 2020-03-13 ENCOUNTER — Other Ambulatory Visit: Payer: Self-pay

## 2020-03-13 VITALS — BP 142/74 | HR 76 | Temp 98.1°F | Ht 74.0 in | Wt 300.0 lb

## 2020-03-13 DIAGNOSIS — R972 Elevated prostate specific antigen [PSA]: Secondary | ICD-10-CM | POA: Diagnosis not present

## 2020-03-13 DIAGNOSIS — N3021 Other chronic cystitis with hematuria: Secondary | ICD-10-CM

## 2020-03-13 DIAGNOSIS — N400 Enlarged prostate without lower urinary tract symptoms: Secondary | ICD-10-CM

## 2020-03-13 DIAGNOSIS — R310 Gross hematuria: Secondary | ICD-10-CM

## 2020-03-13 LAB — POCT URINALYSIS DIPSTICK
Glucose, UA: NEGATIVE
Ketones, UA: NEGATIVE
Nitrite, UA: POSITIVE
Protein, UA: POSITIVE — AB
Spec Grav, UA: 1.02 (ref 1.010–1.025)
Urobilinogen, UA: NEGATIVE E.U./dL — AB
pH, UA: 6 (ref 5.0–8.0)

## 2020-03-13 LAB — URINALYSIS, ROUTINE W REFLEX MICROSCOPIC
Bilirubin Urine: NEGATIVE
Glucose, UA: NEGATIVE mg/dL
Ketones, ur: NEGATIVE mg/dL
Nitrite: POSITIVE — AB
Protein, ur: NEGATIVE mg/dL
Specific Gravity, Urine: 1.017 (ref 1.005–1.030)
WBC, UA: >50 WBC/hpf — ABNORMAL HIGH (ref 0–5)
pH: 6 (ref 5.0–8.0)

## 2020-03-15 LAB — URINE CULTURE: Culture: >=100000 — AB

## 2020-03-20 ENCOUNTER — Telehealth: Payer: Self-pay

## 2020-03-20 DIAGNOSIS — N3021 Other chronic cystitis with hematuria: Secondary | ICD-10-CM

## 2020-03-20 MED ORDER — NITROFURANTOIN MACROCRYSTAL 50 MG PO CAPS: 50.0000 mg | ORAL_CAPSULE | Freq: Every day | ORAL | 2 refills | Status: DC

## 2020-03-20 MED ORDER — NITROFURANTOIN MONOHYD MACRO 100 MG PO CAPS
100.0000 mg | ORAL_CAPSULE | Freq: Two times a day (BID) | ORAL | 0 refills | Status: DC
Start: 2020-03-20 — End: 2020-09-11

## 2020-03-20 NOTE — Telephone Encounter (Signed)
Pt called back. Made aware of urine results and prescriptions sent in and appt made with pt

## 2020-03-20 NOTE — Telephone Encounter (Signed)
Prescriptions sent in. Left message to return call.

## 2020-03-20 NOTE — Telephone Encounter (Signed)
-----   Message from Irine Seal, MD sent at 03/18/2020  6:47 PM EDT ----- His culture is positive.  He needs macrobid 100mg  po bid #14 and the nitrofurantoin 50mg  po qhs #30 with 2 refills and then he should f/u in about 3 months.  ----- Message ----- From: Dorisann Frames, RN Sent: 03/17/2020  11:17 AM EDT To: Irine Seal, MD  UA and urine culture report

## 2020-04-17 DIAGNOSIS — I10 Essential (primary) hypertension: Secondary | ICD-10-CM | POA: Diagnosis not present

## 2020-04-17 DIAGNOSIS — G4733 Obstructive sleep apnea (adult) (pediatric): Secondary | ICD-10-CM | POA: Diagnosis not present

## 2020-04-17 DIAGNOSIS — R7301 Impaired fasting glucose: Secondary | ICD-10-CM | POA: Diagnosis not present

## 2020-04-17 DIAGNOSIS — E782 Mixed hyperlipidemia: Secondary | ICD-10-CM | POA: Diagnosis not present

## 2020-04-17 DIAGNOSIS — K219 Gastro-esophageal reflux disease without esophagitis: Secondary | ICD-10-CM | POA: Diagnosis not present

## 2020-04-21 DIAGNOSIS — R7301 Impaired fasting glucose: Secondary | ICD-10-CM | POA: Diagnosis not present

## 2020-04-21 DIAGNOSIS — E782 Mixed hyperlipidemia: Secondary | ICD-10-CM | POA: Diagnosis not present

## 2020-04-21 DIAGNOSIS — G4733 Obstructive sleep apnea (adult) (pediatric): Secondary | ICD-10-CM | POA: Diagnosis not present

## 2020-04-21 DIAGNOSIS — Z23 Encounter for immunization: Secondary | ICD-10-CM | POA: Diagnosis not present

## 2020-04-21 DIAGNOSIS — I10 Essential (primary) hypertension: Secondary | ICD-10-CM | POA: Diagnosis not present

## 2020-05-04 ENCOUNTER — Other Ambulatory Visit (HOSPITAL_BASED_OUTPATIENT_CLINIC_OR_DEPARTMENT_OTHER): Payer: Self-pay

## 2020-05-04 DIAGNOSIS — G4710 Hypersomnia, unspecified: Secondary | ICD-10-CM

## 2020-05-04 DIAGNOSIS — R0683 Snoring: Secondary | ICD-10-CM

## 2020-05-04 DIAGNOSIS — G4739 Other sleep apnea: Secondary | ICD-10-CM

## 2020-05-04 DIAGNOSIS — R5383 Other fatigue: Secondary | ICD-10-CM

## 2020-05-08 DIAGNOSIS — H2513 Age-related nuclear cataract, bilateral: Secondary | ICD-10-CM | POA: Diagnosis not present

## 2020-05-08 DIAGNOSIS — H40033 Anatomical narrow angle, bilateral: Secondary | ICD-10-CM | POA: Diagnosis not present

## 2020-05-12 ENCOUNTER — Ambulatory Visit: Payer: Medicare Other | Attending: Family Medicine | Admitting: Neurology

## 2020-05-12 ENCOUNTER — Other Ambulatory Visit: Payer: Self-pay

## 2020-05-12 DIAGNOSIS — R5383 Other fatigue: Secondary | ICD-10-CM | POA: Insufficient documentation

## 2020-05-12 DIAGNOSIS — G4710 Hypersomnia, unspecified: Secondary | ICD-10-CM | POA: Insufficient documentation

## 2020-05-12 DIAGNOSIS — G4739 Other sleep apnea: Secondary | ICD-10-CM | POA: Insufficient documentation

## 2020-05-12 DIAGNOSIS — R0683 Snoring: Secondary | ICD-10-CM | POA: Diagnosis not present

## 2020-05-12 DIAGNOSIS — G4733 Obstructive sleep apnea (adult) (pediatric): Secondary | ICD-10-CM | POA: Diagnosis not present

## 2020-05-21 NOTE — Procedures (Signed)
  [place] A. Merlene Laughter, MD     [email or web address]             HOME SLEEP STUDY  LOCATION: [facility]  Patient Name: Benjamin Colon, Benjamin Colon Date: 05/12/2020 Gender: Male D.O.B: Oct 17, 1951 Age (years): 4 Referring Provider: Judd Lien MD Height (inches): 99 Interpreting Physician: Phillips Odor MD, ABSM Weight (lbs): 300 RPSGT: Peak, Robert BMI: 39 MRN: [id number] Neck Size: CLINICAL INFORMATION Sleep Study Type: HST     Indication for sleep study: N/A     Epworth Sleepiness Score: NA  SLEEP STUDY TECHNIQUE A multi-channel overnight portable sleep study was performed. The channels recorded were: nasal airflow, thoracic respiratory movement, and oxygen saturation with a pulse oximetry. Snoring was also monitored.  MEDICATIONS Patient self administered medications include: N/A.  Current Outpatient Medications:  .  atorvastatin (LIPITOR) 20 MG tablet, Take 20 mg by mouth daily., Disp: , Rfl:  .  hydrochlorothiazide (HYDRODIURIL) 12.5 MG tablet, Take 12.5 mg by mouth daily., Disp: , Rfl:  .  losartan (COZAAR) 100 MG tablet, Take 100 mg by mouth daily., Disp: , Rfl:  .  nitrofurantoin (MACRODANTIN) 50 MG capsule, Take 1 capsule (50 mg total) by mouth at bedtime., Disp: 30 capsule, Rfl: 2 .  nitrofurantoin, macrocrystal-monohydrate, (MACROBID) 100 MG capsule, Take 1 capsule (100 mg total) by mouth every 12 (twelve) hours. (Patient not taking: Reported on 03/13/2020), Disp: 14 capsule, Rfl: 0 .  nitrofurantoin, macrocrystal-monohydrate, (MACROBID) 100 MG capsule, Take 1 capsule (100 mg total) by mouth every 12 (twelve) hours., Disp: 14 capsule, Rfl: 0 .  omeprazole (PRILOSEC) 40 MG capsule, Take 40 mg by mouth daily., Disp: , Rfl:    SLEEP ARCHITECTURE Patient was studied for 339.9 minutes. The sleep efficiency was 70.8 % and the patient was supine for 98.1%. The arousal index was 0.0 per hour.  RESPIRATORY PARAMETERS The overall AHI was 49.8 per  hour, with a central apnea index of 0.0 per hour.  The oxygen nadir was 77% during sleep.     CARDIAC DATA Mean heart rate during sleep was 43.9 bpm.  IMPRESSIONS 1.  Severe obstructive sleep apnea syndrome is documented with this study. Auto PAP 8-15 is recommended.     Delano Metz, MD Diplomate, American Board of Sleep Medicine.  ELECTRONICALLY SIGNED ON:  05/21/2020, 12:07 PM [place] PH: [phone]   FX: [phone] [place]

## 2020-05-27 DIAGNOSIS — K219 Gastro-esophageal reflux disease without esophagitis: Secondary | ICD-10-CM | POA: Diagnosis not present

## 2020-05-27 DIAGNOSIS — E7849 Other hyperlipidemia: Secondary | ICD-10-CM | POA: Diagnosis not present

## 2020-05-27 DIAGNOSIS — I10 Essential (primary) hypertension: Secondary | ICD-10-CM | POA: Diagnosis not present

## 2020-06-19 ENCOUNTER — Ambulatory Visit: Payer: Medicare Other | Admitting: Urology

## 2020-07-28 DIAGNOSIS — E7849 Other hyperlipidemia: Secondary | ICD-10-CM | POA: Diagnosis not present

## 2020-07-28 DIAGNOSIS — K219 Gastro-esophageal reflux disease without esophagitis: Secondary | ICD-10-CM | POA: Diagnosis not present

## 2020-07-28 DIAGNOSIS — I10 Essential (primary) hypertension: Secondary | ICD-10-CM | POA: Diagnosis not present

## 2020-08-27 DIAGNOSIS — K219 Gastro-esophageal reflux disease without esophagitis: Secondary | ICD-10-CM | POA: Diagnosis not present

## 2020-08-27 DIAGNOSIS — I10 Essential (primary) hypertension: Secondary | ICD-10-CM | POA: Diagnosis not present

## 2020-08-27 DIAGNOSIS — E7849 Other hyperlipidemia: Secondary | ICD-10-CM | POA: Diagnosis not present

## 2020-08-31 DIAGNOSIS — Z86010 Personal history of colonic polyps: Secondary | ICD-10-CM | POA: Diagnosis not present

## 2020-09-02 ENCOUNTER — Other Ambulatory Visit: Payer: Medicare Other

## 2020-09-08 ENCOUNTER — Other Ambulatory Visit: Payer: Medicare Other

## 2020-09-08 ENCOUNTER — Other Ambulatory Visit: Payer: Self-pay

## 2020-09-08 DIAGNOSIS — R972 Elevated prostate specific antigen [PSA]: Secondary | ICD-10-CM

## 2020-09-09 LAB — PSA, TOTAL AND FREE
PSA, Free Pct: 19.3 %
PSA, Free: 2.01 ng/mL
Prostate Specific Ag, Serum: 10.4 ng/mL — ABNORMAL HIGH (ref 0.0–4.0)

## 2020-09-10 NOTE — Progress Notes (Signed)
Subjective:  1. Elevated PSA   2. Chronic cystitis with hematuria   3. Benign prostatic hyperplasia without lower urinary tract symptoms   4. Nocturia       Mr. Cretella has a  history of UTI's, an elevated PSA and hematuria.  His PSA prior to this visit is 10.4 with a 19% f/t ratio.  His UA today is consistent with a UTI.  He has had some symptoms with mild dysuria over the last 2 months.  He has had intermittent hematuria.   He has previously had e. Coli on most of his cultures.   He has was on macrobid at one point in the past year. He had been on bactrim DS 1/2 po qhs several years ago with some benefit.   His last PSA was 7.9 in the fall of 2020 which is reasonably stable over the last few years. He has a history of UTI's and an elevated PSA. He is no longer on macrobid for suppression. He has no dysuria. He has had intermittent hematuria that is long standing and he has an unusual sensation at the end of the stream.  He has had some intermittent terminal dysuria and occasional hematuria.      GU Hx: Mr. Alipio is sent in consultation by Dr. Pleas Koch for an elevated PSA of 9.7 in 10/18 which was up from 7.2 in 5/18. He had a negative biopsy in 8/16 by Dr. Exie Parody for a PSA os 10.2 in 3/16. He had a subsequent prostate MRI in 3/17 that showed a 6x14mm lesion at the right lateral base that could represent low grade cancer. He had another biopsy in 4/17 that was negative. He has had 2 prior TURPs with the last in 2014. He has had the bleeding intermittently since then but had a cystoscopy in 2017. He has had a history of UTI's prior to the TURP but no symptomatic infections since. He has no other associated signs or symptoms.      ROS:  ROS:  A complete review of systems was performed.  All systems are negative except for pertinent findings as noted.   Review of Systems  All other systems reviewed and are negative.   IPSS    Row Name 09/11/20 1000         International Prostate Symptom  Score   How often have you had the sensation of not emptying your bladder? Not at All     How often have you had to urinate less than every two hours? Less than 1 in 5 times     How often have you found you stopped and started again several times when you urinated? Less than 1 in 5 times     How often have you found it difficult to postpone urination? Not at All     How often have you had a weak urinary stream? Not at All     How often have you had to strain to start urination? Not at All     How many times did you typically get up at night to urinate? 3 Times     Total IPSS Score 5       Quality of Life due to urinary symptoms   If you were to spend the rest of your life with your urinary condition just the way it is now how would you feel about that? Mostly Satisfied             Not on File  Outpatient Encounter  Medications as of 09/11/2020  Medication Sig  . atorvastatin (LIPITOR) 20 MG tablet Take 20 mg by mouth daily.  . hydrochlorothiazide (HYDRODIURIL) 12.5 MG tablet Take 12.5 mg by mouth daily.  Marland Kitchen losartan (COZAAR) 100 MG tablet Take 100 mg by mouth daily.  Marland Kitchen omeprazole (PRILOSEC) 20 MG capsule Take 20 mg by mouth daily.  . [DISCONTINUED] nitrofurantoin (MACRODANTIN) 50 MG capsule Take 1 capsule (50 mg total) by mouth at bedtime.  . [DISCONTINUED] nitrofurantoin, macrocrystal-monohydrate, (MACROBID) 100 MG capsule Take 1 capsule (100 mg total) by mouth every 12 (twelve) hours. (Patient not taking: Reported on 03/13/2020)  . [DISCONTINUED] nitrofurantoin, macrocrystal-monohydrate, (MACROBID) 100 MG capsule Take 1 capsule (100 mg total) by mouth every 12 (twelve) hours.  . [DISCONTINUED] omeprazole (PRILOSEC) 40 MG capsule Take 40 mg by mouth daily.   No facility-administered encounter medications on file as of 09/11/2020.    Past Medical History:  Diagnosis Date  . Anxiety   . Asbestosis ([place])   . GERD (gastroesophageal reflux disease)   . Hypercholesteremia   .  Hypertension     Past Surgical History:  Procedure Laterality Date  . PROSTATE BIOPSY  02/2016  . PROSTATE BIOPSY  06/2015  . TRANSURETHRAL RESECTION OF PROSTATE  2014   He had one prior as well  . VIDEO ASSISTED THORACOSCOPY (VATS)/DECORTICATION Left 2006    Social History   Socioeconomic History  . Marital status: Married    Spouse name: Not on file  . Number of children: Not on file  . Years of education: Not on file  . Highest education level: Not on file  Occupational History  . Not on file  Tobacco Use  . Smoking status: Never Smoker  . Smokeless tobacco: Never Used  Substance and Sexual Activity  . Alcohol use: Never  . Drug use: Not on file  . Sexual activity: Not on file  Other Topics Concern  . Not on file  Social History Narrative  . Not on file   Social Determinants of Health   Financial Resource Strain:   . Difficulty of Paying Living Expenses: Not on file  Food Insecurity:   . Worried About Charity fundraiser in the Last Year: Not on file  . Ran Out of Food in the Last Year: Not on file  Transportation Needs:   . Lack of Transportation (Medical): Not on file  . Lack of Transportation (Non-Medical): Not on file  Physical Activity:   . Days of Exercise per Week: Not on file  . Minutes of Exercise per Session: Not on file  Stress:   . Feeling of Stress : Not on file  Social Connections:   . Frequency of Communication with Friends and Family: Not on file  . Frequency of Social Gatherings with Friends and Family: Not on file  . Attends Religious Services: Not on file  . Active Member of Clubs or Organizations: Not on file  . Attends Archivist Meetings: Not on file  . Marital Status: Not on file  Intimate Partner Violence:   . Fear of Current or Ex-Partner: Not on file  . Emotionally Abused: Not on file  . Physically Abused: Not on file  . Sexually Abused: Not on file    Family History  Problem Relation Age of Onset  .  Nephrolithiasis Father        Objective: Vitals:   09/11/20 0932  BP: (!) 157/72  Pulse: 65  Temp: 98.4 F (36.9 C)  Physical Exam Vitals reviewed.  Constitutional:      Appearance: Normal appearance.  Genitourinary:    Comments: AP with small skin tags. NST without mass. Prostate 3+ firm without nodules. SV non-palpable.  Neurological:     Mental Status: He is alert.     Lab Results:   His urine culture on 11/01/19 was negative.    Results for orders placed or performed in visit on 09/11/20 (from the past 24 hour(s))  Urinalysis, Routine w reflex microscopic     Status: Abnormal   Collection Time: 09/11/20  9:42 AM  Result Value Ref Range   Specific Gravity, UA 1.025 1.005 - 1.030   pH, UA 6.5 5.0 - 7.5   Color, UA Amber (A) Yellow   Appearance Ur Cloudy (A) Clear   Leukocytes,UA 1+ (A) Negative   Protein,UA 1+ (A) Negative/Trace   Glucose, UA Negative Negative   Ketones, UA Negative Negative   RBC, UA Trace (A) Negative   Bilirubin, UA Negative Negative   Urobilinogen, Ur 0.2 0.2 - 1.0 mg/dL   Nitrite, UA Positive (A) Negative   Microscopic Examination See below:    Narrative   Performed at:  [facility] [address], [place], [place]  [id number] Lab Director: Mina Marble MT, Phone:  [phone]  Microscopic Examination     Status: Abnormal   Collection Time: 09/11/20  9:42 AM   Urine  Result Value Ref Range   WBC, UA 11-30 (A) 0 - 5 /hpf   RBC 3-10 (A) 0 - 2 /hpf   Epithelial Cells (non renal) 0-10 0 - 10 /hpf   Renal Epithel, UA 0-10 (A) None seen /hpf   Bacteria, UA Many (A) None seen/Few   Narrative   Performed at:  [facility] [address] [place], [place]  [id number] Lab Director: Mina Marble MT, Phone:  [phone]    Recent Results (from the past 2160 hour(s))  PSA, total and free     Status: Abnormal   Collection Time: 09/08/20  8:43 AM  Result Value Ref Range   Prostate Specific Ag, Serum  10.4 (H) 0.0 - 4.0 ng/mL    Comment: Roche ECLIA methodology. According to the American Urological Association, Serum PSA should decrease and remain at undetectable levels after radical prostatectomy. The AUA defines biochemical recurrence as an initial PSA value 0.2 ng/mL or greater followed by a subsequent confirmatory PSA value 0.2 ng/mL or greater. Values obtained with different assay methods or kits cannot be used interchangeably. Results cannot be interpreted as absolute evidence of the presence or absence of malignant disease.    PSA, Free 2.01 N/A ng/mL    Comment: Roche ECLIA methodology.   PSA, Free Pct 19.3 %    Comment: The table below lists the probability of prostate cancer for men with non-suspicious DRE results and total PSA between 4 and 10 ng/mL, by patient age Ricci Barker, [place], 295:1884).                   % Free PSA       50-64 yr        65-75 yr                   0.00-10.00%        56%             55%  10.01-15.00%        24%             35%                  15.01-20.00%        17%             23%                  20.01-25.00%        10%             20%                       >25.00%         5%              9% Please note:  Catalona et al did not make specific               recommendations regarding the use of               percent free PSA for any other population               of men.   Urinalysis, Routine w reflex microscopic     Status: Abnormal   Collection Time: 09/11/20  9:42 AM  Result Value Ref Range   Specific Gravity, UA 1.025 1.005 - 1.030   pH, UA 6.5 5.0 - 7.5   Color, UA Amber (A) Yellow   Appearance Ur Cloudy (A) Clear   Leukocytes,UA 1+ (A) Negative   Protein,UA 1+ (A) Negative/Trace   Glucose, UA Negative Negative   Ketones, UA Negative Negative   RBC, UA Trace (A) Negative   Bilirubin, UA Negative Negative   Urobilinogen, Ur 0.2 0.2 - 1.0 mg/dL   Nitrite, UA Positive (A) Negative   Microscopic Examination See  below:   Microscopic Examination     Status: Abnormal   Collection Time: 09/11/20  9:42 AM   Urine  Result Value Ref Range   WBC, UA 11-30 (A) 0 - 5 /hpf   RBC 3-10 (A) 0 - 2 /hpf   Epithelial Cells (non renal) 0-10 0 - 10 /hpf   Renal Epithel, UA 0-10 (A) None seen /hpf   Bacteria, UA Many (A) None seen/Few     Studies/Results: No results found.    Assessment & Plan: He has intermittent gross hematuria with some terminal dysuria and his UA looks infected again today.  I will get a culture.   We discussed resuming suppression but his symptoms are so minimal that we will forgo that at this time.   He will return in 1 year with a PSA.     Elevated PSA.  It is up from last year but stable over the last 5 years. Repeat PSA in 12 months.   BPH with minimal LUTS.   His exam is benign.    No orders of the defined types were placed in this encounter.    Orders Placed This Encounter  Procedures  . Microscopic Examination  . Urine Culture  . Urinalysis, Routine w reflex microscopic  . PSA, total and free    Standing Status:   Future    Standing Expiration Date:   09/11/2021      Return in about 1 year (around 09/11/2021) for PSA prior to f/u. Marland Kitchen   CC: [name], Virgina Evener, MD      Irine Seal  09/11/2020  

## 2020-09-11 ENCOUNTER — Other Ambulatory Visit: Payer: Self-pay

## 2020-09-11 ENCOUNTER — Ambulatory Visit (INDEPENDENT_AMBULATORY_CARE_PROVIDER_SITE_OTHER): Payer: Medicare Other | Admitting: Urology

## 2020-09-11 ENCOUNTER — Encounter: Payer: Self-pay | Admitting: Urology

## 2020-09-11 ENCOUNTER — Ambulatory Visit: Payer: Medicare Other | Admitting: Urology

## 2020-09-11 VITALS — BP 157/72 | HR 65 | Temp 98.4°F | Ht 74.0 in | Wt 300.0 lb

## 2020-09-11 DIAGNOSIS — N3021 Other chronic cystitis with hematuria: Secondary | ICD-10-CM | POA: Diagnosis not present

## 2020-09-11 DIAGNOSIS — R351 Nocturia: Secondary | ICD-10-CM | POA: Diagnosis not present

## 2020-09-11 DIAGNOSIS — R972 Elevated prostate specific antigen [PSA]: Secondary | ICD-10-CM | POA: Diagnosis not present

## 2020-09-11 DIAGNOSIS — N400 Enlarged prostate without lower urinary tract symptoms: Secondary | ICD-10-CM | POA: Diagnosis not present

## 2020-09-11 LAB — URINALYSIS, ROUTINE W REFLEX MICROSCOPIC
Bilirubin, UA: NEGATIVE
Glucose, UA: NEGATIVE
Ketones, UA: NEGATIVE
Nitrite, UA: POSITIVE — AB
Specific Gravity, UA: 1.025 (ref 1.005–1.030)
Urobilinogen, Ur: 0.2 mg/dL (ref 0.2–1.0)
pH, UA: 6.5 (ref 5.0–7.5)

## 2020-09-11 LAB — MICROSCOPIC EXAMINATION

## 2020-09-11 NOTE — Progress Notes (Signed)
Urological Symptom Review  Patient is experiencing the following symptoms: Some burning with urination Get up at night 2-3 times Blood in urine at times Thinks he may have a UTI   Review of Systems  Gastrointestinal (upper)  : Indigestion/heartburn  Gastrointestinal (lower) : Negative for lower GI symptoms  Constitutional : Negative for symptoms  Skin: Negative for skin symptoms  Eyes: Negative for eye symptoms  Ear/Nose/Throat : Negative for Ear/Nose/Throat symptoms  Hematologic/Lymphatic: Negative for Hematologic/Lymphatic symptoms  Cardiovascular : Negative for cardiovascular symptoms  Respiratory : Negative for respiratory symptoms  Endocrine: Negative for endocrine symptoms  Musculoskeletal: Negative for musculoskeletal symptoms  Neurological: Negative for neurological symptoms  Psychologic: Negative for psychiatric symptoms

## 2020-09-16 ENCOUNTER — Telehealth: Payer: Self-pay

## 2020-09-16 LAB — URINE CULTURE

## 2020-09-16 NOTE — Telephone Encounter (Signed)
Letter mailed

## 2020-09-16 NOTE — Telephone Encounter (Signed)
-----   Message from Irine Seal, MD sent at 09/16/2020  1:05 PM EDT ----- He has a positive culture but we had discussed not treating since his symptoms were so mild and he will inevitably recur.   He should just f/u as planned unless he has worsening symptoms.

## 2020-09-22 DIAGNOSIS — Z01818 Encounter for other preprocedural examination: Secondary | ICD-10-CM | POA: Diagnosis not present

## 2020-09-25 DIAGNOSIS — Z1211 Encounter for screening for malignant neoplasm of colon: Secondary | ICD-10-CM | POA: Diagnosis not present

## 2020-09-25 DIAGNOSIS — Z86010 Personal history of colonic polyps: Secondary | ICD-10-CM | POA: Diagnosis not present

## 2020-09-25 DIAGNOSIS — E785 Hyperlipidemia, unspecified: Secondary | ICD-10-CM | POA: Diagnosis not present

## 2020-09-25 DIAGNOSIS — K641 Second degree hemorrhoids: Secondary | ICD-10-CM | POA: Diagnosis not present

## 2020-09-25 DIAGNOSIS — Z79899 Other long term (current) drug therapy: Secondary | ICD-10-CM | POA: Diagnosis not present

## 2020-09-25 DIAGNOSIS — K644 Residual hemorrhoidal skin tags: Secondary | ICD-10-CM | POA: Diagnosis not present

## 2020-09-25 DIAGNOSIS — K219 Gastro-esophageal reflux disease without esophagitis: Secondary | ICD-10-CM | POA: Diagnosis not present

## 2020-09-25 DIAGNOSIS — I10 Essential (primary) hypertension: Secondary | ICD-10-CM | POA: Diagnosis not present

## 2020-10-19 DIAGNOSIS — E782 Mixed hyperlipidemia: Secondary | ICD-10-CM | POA: Diagnosis not present

## 2020-10-19 DIAGNOSIS — E559 Vitamin D deficiency, unspecified: Secondary | ICD-10-CM | POA: Diagnosis not present

## 2020-10-19 DIAGNOSIS — K219 Gastro-esophageal reflux disease without esophagitis: Secondary | ICD-10-CM | POA: Diagnosis not present

## 2020-10-19 DIAGNOSIS — I10 Essential (primary) hypertension: Secondary | ICD-10-CM | POA: Diagnosis not present

## 2020-10-19 DIAGNOSIS — D72829 Elevated white blood cell count, unspecified: Secondary | ICD-10-CM | POA: Diagnosis not present

## 2020-10-26 DIAGNOSIS — Z1389 Encounter for screening for other disorder: Secondary | ICD-10-CM | POA: Diagnosis not present

## 2020-10-26 DIAGNOSIS — Z23 Encounter for immunization: Secondary | ICD-10-CM | POA: Diagnosis not present

## 2020-10-26 DIAGNOSIS — Z0001 Encounter for general adult medical examination with abnormal findings: Secondary | ICD-10-CM | POA: Diagnosis not present

## 2020-10-26 DIAGNOSIS — D126 Benign neoplasm of colon, unspecified: Secondary | ICD-10-CM | POA: Diagnosis not present

## 2020-11-25 DIAGNOSIS — R600 Localized edema: Secondary | ICD-10-CM | POA: Diagnosis not present

## 2020-11-25 DIAGNOSIS — I10 Essential (primary) hypertension: Secondary | ICD-10-CM | POA: Diagnosis not present

## 2020-11-26 ENCOUNTER — Other Ambulatory Visit: Payer: Self-pay

## 2020-11-26 ENCOUNTER — Other Ambulatory Visit: Payer: Self-pay | Admitting: Family Medicine

## 2020-11-26 ENCOUNTER — Ambulatory Visit (HOSPITAL_COMMUNITY)
Admission: RE | Admit: 2020-11-26 | Discharge: 2020-11-26 | Disposition: A | Discharge status: Home / Self Care | Payer: Medicare Other | Source: Ambulatory Visit | Attending: Family Medicine | Admitting: Family Medicine

## 2020-11-26 ENCOUNTER — Other Ambulatory Visit (HOSPITAL_COMMUNITY): Payer: Self-pay | Admitting: Family Medicine

## 2020-11-26 DIAGNOSIS — R600 Localized edema: Secondary | ICD-10-CM

## 2020-11-27 DIAGNOSIS — K219 Gastro-esophageal reflux disease without esophagitis: Secondary | ICD-10-CM | POA: Diagnosis not present

## 2020-11-27 DIAGNOSIS — I10 Essential (primary) hypertension: Secondary | ICD-10-CM | POA: Diagnosis not present

## 2020-11-27 DIAGNOSIS — E7849 Other hyperlipidemia: Secondary | ICD-10-CM | POA: Diagnosis not present

## 2020-12-03 DIAGNOSIS — I517 Cardiomegaly: Secondary | ICD-10-CM | POA: Diagnosis not present

## 2020-12-03 DIAGNOSIS — R600 Localized edema: Secondary | ICD-10-CM | POA: Diagnosis not present

## 2020-12-26 DIAGNOSIS — K219 Gastro-esophageal reflux disease without esophagitis: Secondary | ICD-10-CM | POA: Diagnosis not present

## 2020-12-26 DIAGNOSIS — E7849 Other hyperlipidemia: Secondary | ICD-10-CM | POA: Diagnosis not present

## 2020-12-26 DIAGNOSIS — I10 Essential (primary) hypertension: Secondary | ICD-10-CM | POA: Diagnosis not present

## 2021-01-25 DIAGNOSIS — K219 Gastro-esophageal reflux disease without esophagitis: Secondary | ICD-10-CM | POA: Diagnosis not present

## 2021-01-25 DIAGNOSIS — I10 Essential (primary) hypertension: Secondary | ICD-10-CM | POA: Diagnosis not present

## 2021-01-25 DIAGNOSIS — E7849 Other hyperlipidemia: Secondary | ICD-10-CM | POA: Diagnosis not present

## 2021-03-27 DIAGNOSIS — E7849 Other hyperlipidemia: Secondary | ICD-10-CM | POA: Diagnosis not present

## 2021-03-27 DIAGNOSIS — K219 Gastro-esophageal reflux disease without esophagitis: Secondary | ICD-10-CM | POA: Diagnosis not present

## 2021-03-27 DIAGNOSIS — I10 Essential (primary) hypertension: Secondary | ICD-10-CM | POA: Diagnosis not present

## 2021-03-31 ENCOUNTER — Telehealth: Payer: Self-pay

## 2021-04-01 ENCOUNTER — Other Ambulatory Visit: Payer: Self-pay

## 2021-04-01 ENCOUNTER — Ambulatory Visit: Payer: Medicare Other | Admitting: Urology

## 2021-04-01 VITALS — BP 151/78 | HR 70 | Wt 300.0 lb

## 2021-04-01 DIAGNOSIS — R339 Retention of urine, unspecified: Secondary | ICD-10-CM | POA: Diagnosis not present

## 2021-04-01 DIAGNOSIS — N3091 Cystitis, unspecified with hematuria: Secondary | ICD-10-CM

## 2021-04-01 DIAGNOSIS — R3915 Urgency of urination: Secondary | ICD-10-CM

## 2021-04-01 DIAGNOSIS — R310 Gross hematuria: Secondary | ICD-10-CM

## 2021-04-01 LAB — URINALYSIS, ROUTINE W REFLEX MICROSCOPIC

## 2021-04-01 LAB — MICROSCOPIC EXAMINATION: RBC, Urine: >30 /hpf — AB (ref 0–2)

## 2021-04-01 LAB — BLADDER SCAN: Scan Result: 252

## 2021-04-01 MED ORDER — FINASTERIDE 5 MG PO TABS: 5.0000 mg | ORAL_TABLET | Freq: Every day | ORAL | 3 refills | Status: DC

## 2021-04-01 MED ORDER — CEPHALEXIN 500 MG PO CAPS: 500.0000 mg | ORAL_CAPSULE | Freq: Four times a day (QID) | ORAL | 0 refills | Status: AC

## 2021-04-01 MED ORDER — LIDOCAINE HCL 1 % IJ SOLN
2.1000 mL | Freq: Once | INTRAMUSCULAR | Status: AC
  Administered 2021-04-01: 2.1 mL via INTRADERMAL

## 2021-04-01 MED ORDER — CEFTRIAXONE SODIUM 1 G IJ SOLR
1.0000 g | Freq: Once | INTRAMUSCULAR | Status: AC
  Administered 2021-04-01: 1 g via INTRAMUSCULAR

## 2021-04-01 NOTE — Progress Notes (Signed)
Subjective:  1. Gross hematuria   2. Hemorrhagic cystitis   3. Urinary urgency   4. Incomplete bladder emptying       Mr. Rielly has a  history of UTI's, an elevated PSA and hematuria.  His PSA prior to this visit is 10.4 with a 19% f/t ratio.  His UA today is consistent with a UTI.  He has had some symptoms with mild dysuria over the last 2 months.  He has had intermittent hematuria.   He has previously had e. Coli on most of his cultures.   He has was on macrobid at one point in the past year. He had been on bactrim DS 1/2 po qhs several years ago with some benefit.   04/01/21: Filbert returns today in f/u with the onset Tuesday of gross hematuria which has been progressive and associated with marked irritative voiding symtoms and passage of clots.   He has had some fever about a week ago.  He has no flank pain.  He has suprapubic pressure.  His UA looked infected.   Prior to onset he had no voiding complaints.    GU Hx: Mr. Mullikin is sent in consultation by Dr. Pleas Koch for an elevated PSA of 9.7 in 10/18 which was up from 7.2 in 5/18. He had a negative biopsy in 8/16 by Dr. Exie Parody for a PSA os 10.2 in 3/16. He had a subsequent prostate MRI in 3/17 that showed a 6x36mm lesion at the right lateral base that could represent low grade cancer. He had another biopsy in 4/17 that was negative. He has had 2 prior TURPs with the last in 2014. He has had the bleeding intermittently since then but had a cystoscopy in 2017. He has had a history of UTI's prior to the TURP but no symptomatic infections since. He has no other associated signs or symptoms.   His last PSA was 7.9 in the fall of 2020 which is reasonably stable over the last few years. He has a history of UTI's and an elevated PSA. He is no longer on macrobid for suppression. He has no dysuria. He has had intermittent hematuria that is long standing and he has an unusual sensation at the end of the stream.  He has had some intermittent terminal  dysuria and occasional hematuria.       ROS:  ROS:  A complete review of systems was performed.  All systems are negative except for pertinent findings as noted.   Review of Systems  All other systems reviewed and are negative.   IPSS    Row Name 04/01/21 0900         International Prostate Symptom Score   How often have you had the sensation of not emptying your bladder? Less than 1 in 5     How often have you had to urinate less than every two hours? Less than 1 in 5 times     How often have you found you stopped and started again several times when you urinated? Less than half the time     How often have you found it difficult to postpone urination? Less than 1 in 5 times     How often have you had a weak urinary stream? Not at All     How often have you had to strain to start urination? Almost always     How many times did you typically get up at night to urinate? 2 Times     Total IPSS  Score 12           Quality of Life due to urinary symptoms   If you were to spend the rest of your life with your urinary condition just the way it is now how would you feel about that? Mostly Disatisfied             Allergies  Allergen Reactions  . Fosinopril Cough    Outpatient Encounter Medications as of 04/01/2021  Medication Sig  . atorvastatin (LIPITOR) 20 MG tablet Take 20 mg by mouth daily.  . cephALEXin (KEFLEX) 500 MG capsule Take 1 capsule (500 mg total) by mouth 4 (four) times daily for 10 days.  . finasteride (PROSCAR) 5 MG tablet Take 1 tablet (5 mg total) by mouth daily.  . hydrochlorothiazide (HYDRODIURIL) 12.5 MG tablet Take 12.5 mg by mouth daily.  Marland Kitchen losartan (COZAAR) 100 MG tablet Take 100 mg by mouth daily.  Marland Kitchen omeprazole (PRILOSEC) 20 MG capsule Take 20 mg by mouth daily.  . [EXPIRED] cefTRIAXone (ROCEPHIN) injection 1 g   . [EXPIRED] lidocaine (XYLOCAINE) 1 % (with pres) injection 2.1 mL    No facility-administered encounter medications on file as of 04/01/2021.     Past Medical History:  Diagnosis Date  . Anxiety   . Asbestosis ([place])   . GERD (gastroesophageal reflux disease)   . Hypercholesteremia   . Hypertension     Past Surgical History:  Procedure Laterality Date  . PROSTATE BIOPSY  02/2016  . PROSTATE BIOPSY  06/2015  . TRANSURETHRAL RESECTION OF PROSTATE  2014   He had one prior as well  . VIDEO ASSISTED THORACOSCOPY (VATS)/DECORTICATION Left 2006    Social History   Socioeconomic History  . Marital status: Married    Spouse name: Not on file  . Number of children: Not on file  . Years of education: Not on file  . Highest education level: Not on file  Occupational History  . Not on file  Tobacco Use  . Smoking status: Never Smoker  . Smokeless tobacco: Never Used  Substance and Sexual Activity  . Alcohol use: Never  . Drug use: Not on file  . Sexual activity: Not on file  Other Topics Concern  . Not on file  Social History Narrative  . Not on file   Social Determinants of Health   Financial Resource Strain: Not on file  Food Insecurity: Not on file  Transportation Needs: Not on file  Physical Activity: Not on file  Stress: Not on file  Social Connections: Not on file  Intimate Partner Violence: Not on file    Family History  Problem Relation Age of Onset  . Nephrolithiasis Father        Objective: Vitals:   04/01/21 0902  BP: (!) 151/78  Pulse: 70     Physical Exam  Lab Results:  Results for orders placed or performed in visit on 04/01/21 (from the past 24 hour(s))  Urinalysis, Routine w reflex microscopic     Status: Abnormal   Collection Time: 04/01/21  9:12 AM  Result Value Ref Range   Specific Gravity, UA CANCELED    pH, UA CANCELED    Color, UA Red (A) Yellow   Appearance Ur Turbid (A) Clear   Protein,UA CANCELED    Glucose, UA CANCELED    Ketones, UA CANCELED    Microscopic Examination See below:    Narrative   Performed at:  [facility] [address]  [place], [place]  [id number] Lab Director: Mina Marble MT, Phone:  [phone]  Microscopic Examination     Status: Abnormal   Collection Time: 04/01/21  9:12 AM   Urine  Result Value Ref Range   WBC, UA 11-30 (A) 0 - 5 /hpf   RBC >30 (A) 0 - 2 /hpf   Epithelial Cells (non renal) 0-10 0 - 10 /hpf   Bacteria, UA Moderate (A) None seen/Few   Narrative   Performed at:  [facility] [address] [place], [place]  [id number] Lab Director: [place], Phone:  [phone]       Results for orders placed or performed in visit on 04/01/21 (from the past 24 hour(s))  Urinalysis, Routine w reflex microscopic     Status: Abnormal   Collection Time: 04/01/21  9:12 AM  Result Value Ref Range   Specific Gravity, UA CANCELED    pH, UA CANCELED    Color, UA Red (A) Yellow   Appearance Ur Turbid (A) Clear   Protein,UA CANCELED    Glucose, UA CANCELED    Ketones, UA CANCELED    Microscopic Examination See below:    Narrative   Performed at:  [facility] [address], [place], [place]  [id number] Lab Director: Mina Marble MT, Phone:  [phone]  Microscopic Examination     Status: Abnormal   Collection Time: 04/01/21  9:12 AM   Urine  Result Value Ref Range   WBC, UA 11-30 (A) 0 - 5 /hpf   RBC >30 (A) 0 - 2 /hpf   Epithelial Cells (non renal) 0-10 0 - 10 /hpf   Bacteria, UA Moderate (A) None seen/Few   Narrative   Performed at:  [facility] [address], [place], [place]  [id number] Lab Director: [place], Phone:  [phone]    Recent Results (from the past 2160 hour(s))  Urinalysis, Routine w reflex microscopic     Status: Abnormal   Collection Time: 04/01/21  9:12 AM  Result Value Ref Range   Specific Gravity, UA CANCELED     Comment: Test not performed  Result canceled by the ancillary.    pH, UA CANCELED     Comment: Test not performed  Result canceled by the ancillary.    Color, UA Red (A) Yellow     Comment: Unable to read or assay due to color interference.   Appearance Ur Turbid (A) Clear   Protein,UA CANCELED     Comment: Test not performed  Result canceled by the ancillary.    Glucose, UA CANCELED     Comment: Test not performed  Result canceled by the ancillary.    Ketones, UA CANCELED     Comment: Test not performed  Result canceled by the ancillary.    Microscopic Examination See below:   Microscopic Examination     Status: Abnormal   Collection Time: 04/01/21  9:12 AM   Urine  Result Value Ref Range   WBC, UA 11-30 (A) 0 - 5 /hpf   RBC >30 (A) 0 - 2 /hpf   Epithelial Cells (non renal) 0-10 0 - 10 /hpf   Bacteria, UA Moderate (A) None seen/Few  Bladder scan     Status: None   Collection Time: 04/01/21  9:30 AM  Result Value Ref Range   Scan Result 252    UA is grossly bloody and looks infected.   Studies/Results: No results found.    Assessment & Plan: Hemorrhagic cystitis  with BPH and BOO.  Urine culture today.  I will give him Rocephin and then start cephalexin.  F/U next week for foley removal and then in 6-8 weeks.   I will also start finasteride and reviewed the side effects.   I will do cystoscopy on return.    Meds ordered this encounter  Medications  . cephALEXin (KEFLEX) 500 MG capsule    Sig: Take 1 capsule (500 mg total) by mouth 4 (four) times daily for 10 days.    Dispense:  40 capsule    Refill:  0  . cefTRIAXone (ROCEPHIN) injection 1 g    Order Specific Question:   Antibiotic Indication:    Answer:   Other Indication (list below)    Order Specific Question:   Other Indication:    Answer:   hematuria  . finasteride (PROSCAR) 5 MG tablet    Sig: Take 1 tablet (5 mg total) by mouth daily.    Dispense:  90 tablet    Refill:  3  . lidocaine (XYLOCAINE) 1 % (with pres) injection 2.1 mL     Orders Placed This Encounter  Procedures  . Urine Culture  . Microscopic Examination  . Urinalysis, Routine w reflex microscopic  . Bladder  scan      Return in about 4 days (around 04/05/2021) for F/u in 4-5 days for foley removal.  Then f/u in 6-8 weeks for an OV and cystoscopy. .   CC: [name], Virgina Evener, MD      Irine Seal 04/01/2021 Patient ID: Trixie Rude, male   DOB: Aug 04, 1951, 70 y.o.   MRN: [id number]

## 2021-04-01 NOTE — Progress Notes (Signed)
Urological Symptom Review  Patient is experiencing the following symptoms: Get up at night to urinate Blood in urine Urinary tract infection Erection problems (male only)   Review of Systems  Gastrointestinal (upper)  : Indigestion/heartburn  Gastrointestinal (lower) : Negative for lower GI symptoms  Constitutional : Fever  Skin: Negative for skin symptoms  Eyes: Negative for eye symptoms  Ear/Nose/Throat : Negative for Ear/Nose/Throat symptoms  Hematologic/Lymphatic: Negative for Hematologic/Lymphatic symptoms  Cardiovascular : Negative for cardiovascular symptoms  Respiratory : Negative for respiratory symptoms  Endocrine: Negative for endocrine symptoms  Musculoskeletal: Negative for musculoskeletal symptoms  Neurological: Negative for neurological symptoms  Psychologic: Negative for psychiatric symptoms

## 2021-04-01 NOTE — Progress Notes (Signed)
Rt. Buttock clean with alcohol. 1 GM Rocephin given. Patient tolerated well.  Simple Catheter Placement  Due to urinary retention patient is present today for a foley cath placement.  Patient was cleaned and prepped in a sterile fashion with betadine. A 20 coude FR foley catheter was inserted, urine return was noted  315ml, urine was red in color.  The balloon was filled with 10cc of sterile water.  A leg bag was attached for drainage. Patient was also given a night bag to take home and was given instruction on how to change from one bag to another.  Patient was given instruction on proper catheter care.  Patient tolerated well, no complications were noted   Performed by: Brita Jurgensen, Lpn  Additional notes/ Follow up: Per MD note

## 2021-04-01 NOTE — Telephone Encounter (Signed)
Patient seen in office today. 

## 2021-04-05 ENCOUNTER — Ambulatory Visit (INDEPENDENT_AMBULATORY_CARE_PROVIDER_SITE_OTHER): Payer: Medicare Other

## 2021-04-05 ENCOUNTER — Other Ambulatory Visit: Payer: Self-pay

## 2021-04-05 DIAGNOSIS — R310 Gross hematuria: Secondary | ICD-10-CM | POA: Diagnosis not present

## 2021-04-05 NOTE — Progress Notes (Signed)
Patient here for catheter removal. Urine clear in bag. 10cc of water removed from balloon without difficulty. 110F Coude catheter removed- patient instructed to return this pm if unable to void.  Patient voiced understanding.

## 2021-04-07 ENCOUNTER — Telehealth: Payer: Self-pay

## 2021-04-07 LAB — URINE CULTURE

## 2021-04-07 NOTE — Telephone Encounter (Signed)
Patient called and made aware.

## 2021-04-07 NOTE — Telephone Encounter (Signed)
-----   Message from Cleon Gustin, MD sent at 04/07/2021  4:20 PM EDT ----- Have them stay on keflex ----- Message ----- From: Dorisann Frames, RN Sent: 04/07/2021   2:15 PM EDT To: Irine Seal, MD, Cleon Gustin, MD  Dr. Alyson Ingles please review in Dr. Jeffie Pollock absence. Patient is currently on Keflex 4/day for 10 days.

## 2021-04-14 NOTE — Telephone Encounter (Signed)
Patient called back, check if he needed to continue on the Keflex. He was advised to stop taking Rx after the 4 times a day for 10 days. No Refills.  Patient understood.  Thanks, Helene Kelp

## 2021-04-19 DIAGNOSIS — Z7689 Persons encountering health services in other specified circumstances: Secondary | ICD-10-CM | POA: Diagnosis not present

## 2021-04-19 DIAGNOSIS — R7301 Impaired fasting glucose: Secondary | ICD-10-CM | POA: Diagnosis not present

## 2021-04-19 DIAGNOSIS — I10 Essential (primary) hypertension: Secondary | ICD-10-CM | POA: Diagnosis not present

## 2021-04-19 DIAGNOSIS — Z1159 Encounter for screening for other viral diseases: Secondary | ICD-10-CM | POA: Diagnosis not present

## 2021-04-19 DIAGNOSIS — E559 Vitamin D deficiency, unspecified: Secondary | ICD-10-CM | POA: Diagnosis not present

## 2021-04-19 DIAGNOSIS — D72829 Elevated white blood cell count, unspecified: Secondary | ICD-10-CM | POA: Diagnosis not present

## 2021-04-19 DIAGNOSIS — E7849 Other hyperlipidemia: Secondary | ICD-10-CM | POA: Diagnosis not present

## 2021-04-19 DIAGNOSIS — E782 Mixed hyperlipidemia: Secondary | ICD-10-CM | POA: Diagnosis not present

## 2021-04-22 DIAGNOSIS — K219 Gastro-esophageal reflux disease without esophagitis: Secondary | ICD-10-CM | POA: Diagnosis not present

## 2021-04-22 DIAGNOSIS — G4733 Obstructive sleep apnea (adult) (pediatric): Secondary | ICD-10-CM | POA: Diagnosis not present

## 2021-04-22 DIAGNOSIS — N3091 Cystitis, unspecified with hematuria: Secondary | ICD-10-CM | POA: Diagnosis not present

## 2021-04-22 DIAGNOSIS — N138 Other obstructive and reflux uropathy: Secondary | ICD-10-CM | POA: Diagnosis not present

## 2021-04-22 DIAGNOSIS — E7849 Other hyperlipidemia: Secondary | ICD-10-CM | POA: Diagnosis not present

## 2021-04-22 DIAGNOSIS — I10 Essential (primary) hypertension: Secondary | ICD-10-CM | POA: Diagnosis not present

## 2021-05-27 ENCOUNTER — Ambulatory Visit: Payer: Medicare Other | Admitting: Urology

## 2021-05-27 ENCOUNTER — Encounter: Payer: Self-pay | Admitting: Urology

## 2021-05-27 ENCOUNTER — Other Ambulatory Visit: Payer: Self-pay

## 2021-05-27 VITALS — BP 164/84 | HR 58 | Temp 98.4°F | Wt 294.4 lb

## 2021-05-27 DIAGNOSIS — N3091 Cystitis, unspecified with hematuria: Secondary | ICD-10-CM

## 2021-05-27 DIAGNOSIS — K219 Gastro-esophageal reflux disease without esophagitis: Secondary | ICD-10-CM | POA: Diagnosis not present

## 2021-05-27 DIAGNOSIS — R972 Elevated prostate specific antigen [PSA]: Secondary | ICD-10-CM

## 2021-05-27 DIAGNOSIS — R339 Retention of urine, unspecified: Secondary | ICD-10-CM

## 2021-05-27 DIAGNOSIS — N400 Enlarged prostate without lower urinary tract symptoms: Secondary | ICD-10-CM

## 2021-05-27 DIAGNOSIS — I10 Essential (primary) hypertension: Secondary | ICD-10-CM | POA: Diagnosis not present

## 2021-05-27 DIAGNOSIS — E7849 Other hyperlipidemia: Secondary | ICD-10-CM | POA: Diagnosis not present

## 2021-05-27 LAB — MICROSCOPIC EXAMINATION
Bacteria, UA: NONE SEEN
Epithelial Cells (non renal): NONE SEEN /hpf (ref 0–10)
RBC, Urine: NONE SEEN /hpf (ref 0–2)
Renal Epithel, UA: NONE SEEN /hpf
WBC, UA: NONE SEEN /hpf (ref 0–5)

## 2021-05-27 LAB — URINALYSIS, ROUTINE W REFLEX MICROSCOPIC
Bilirubin, UA: NEGATIVE
Glucose, UA: NEGATIVE
Ketones, UA: NEGATIVE
Leukocytes,UA: NEGATIVE
Nitrite, UA: NEGATIVE
Protein,UA: NEGATIVE
Specific Gravity, UA: 1.02 (ref 1.005–1.030)
Urobilinogen, Ur: 0.2 mg/dL (ref 0.2–1.0)
pH, UA: 6 (ref 5.0–7.5)

## 2021-05-27 MED ORDER — CIPROFLOXACIN HCL 500 MG PO TABS
500.0000 mg | ORAL_TABLET | Freq: Once | ORAL | Status: AC
  Administered 2021-05-27: 500 mg via ORAL

## 2021-05-27 NOTE — Progress Notes (Signed)
Urological Symptom Review  Patient is experiencing the following symptoms: Get up at night to urinate Blood in urine   Review of Systems  Gastrointestinal (upper)  : Negative for upper GI symptoms  Gastrointestinal (lower) : Negative for lower GI symptoms  Constitutional : Negative for symptoms  Skin: Negative for skin symptoms  Eyes: Blurred vision  Ear/Nose/Throat : Negative for Ear/Nose/Throat symptoms  Hematologic/Lymphatic: Negative for Hematologic/Lymphatic symptoms  Cardiovascular : Leg swelling  Respiratory : Negative for respiratory symptoms  Endocrine: Negative for endocrine symptoms  Musculoskeletal: Joint pain  Neurological: Negative for neurological symptoms  Psychologic: Negative for psychiatric symptoms

## 2021-05-27 NOTE — Progress Notes (Signed)
Subjective:  1. Incomplete bladder emptying   2. Hemorrhagic cystitis   3. Benign prostatic hyperplasia without lower urinary tract symptoms   4. Elevated PSA       Mr. Vester has a  history of UTI's, an elevated PSA and hematuria.  His PSA prior to this visit is 10.4 with a 19% f/t ratio.  His UA today is consistent with a UTI.  He has had some symptoms with mild dysuria over the last 2 months.  He has had intermittent hematuria.   He has previously had e. Coli on most of his cultures.   He has was on macrobid at one point in the past year. He had been on bactrim DS 1/2 po qhs several years ago with some benefit.   05/27/21: Matas returns today in f/u.  He had e. Coli again on his culture on 04/01/21.  He passed a voiding trial on 04/05/21.  He was treated with keflex for the UTI.  He remains on finasteride.   He continues to void well with an IPSS of 2.  His UA today is clear.  He has had no side effects with the finasteride.   04/01/21: Bland returns today in f/u with the onset Tuesday of gross hematuria which has been progressive and associated with marked irritative voiding symtoms and passage of clots.   He has had some fever about a week ago.  He has no flank pain.  He has suprapubic pressure.  His UA looked infected.   Prior to onset he had no voiding complaints.    GU Hx: Mr. Swiatek is sent in consultation by Dr. Pleas Koch for an elevated PSA of 9.7 in 10/18 which was up from 7.2 in 5/18. He had a negative biopsy in 8/16 by Dr. Exie Parody for a PSA os 10.2 in 3/16. He had a subsequent prostate MRI in 3/17 that showed a 6x69mm lesion at the right lateral base that could represent low grade cancer. He had another biopsy in 4/17 that was negative. He has had 2 prior TURPs with the last in 2014. He has had the bleeding intermittently since then but had a cystoscopy in 2017. He has had a history of UTI's prior to the TURP but no symptomatic infections since. He has no other associated signs or symptoms.    His last PSA was 7.9 in the fall of 2020 which is reasonably stable over the last few years. He has a history of UTI's and an elevated PSA. He is no longer on macrobid for suppression. He has no dysuria. He has had intermittent hematuria that is long standing and he has an unusual sensation at the end of the stream.  He has had some intermittent terminal dysuria and occasional hematuria.       ROS:  ROS:  A complete review of systems was performed.  All systems are negative except for pertinent findings as noted.   Review of Systems  All other systems reviewed and are negative.  IPSS     Row Name 05/27/21 1400         International Prostate Symptom Score   How often have you had the sensation of not emptying your bladder? Not at All     How often have you had to urinate less than every two hours? Not at All     How often have you found you stopped and started again several times when you urinated? Less than 1 in 5 times     How  often have you found it difficult to postpone urination? Not at All     How often have you had a weak urinary stream? Not at All     How often have you had to strain to start urination? Not at All     How many times did you typically get up at night to urinate? 1 Time     Total IPSS Score 2           Quality of Life due to urinary symptoms     If you were to spend the rest of your life with your urinary condition just the way it is now how would you feel about that? Pleased               Allergies  Allergen Reactions   Fosinopril Cough    Outpatient Encounter Medications as of 05/27/2021  Medication Sig   atorvastatin (LIPITOR) 20 MG tablet Take 20 mg by mouth daily.   finasteride (PROSCAR) 5 MG tablet Take 1 tablet (5 mg total) by mouth daily.   hydrochlorothiazide (HYDRODIURIL) 12.5 MG tablet Take 12.5 mg by mouth daily.   losartan (COZAAR) 100 MG tablet Take 100 mg by mouth daily.   omeprazole (PRILOSEC) 20 MG capsule Take 20 mg by mouth  daily.   [EXPIRED] ciprofloxacin (CIPRO) tablet 500 mg    No facility-administered encounter medications on file as of 05/27/2021.    Past Medical History:  Diagnosis Date   Anxiety    Asbestosis ([place])    GERD (gastroesophageal reflux disease)    Hypercholesteremia    Hypertension     Past Surgical History:  Procedure Laterality Date   PROSTATE BIOPSY  02/2016   PROSTATE BIOPSY  06/2015   TRANSURETHRAL RESECTION OF PROSTATE  2014   He had one prior as well   VIDEO ASSISTED THORACOSCOPY (VATS)/DECORTICATION Left 2006    Social History   Socioeconomic History   Marital status: Married    Spouse name: Not on file   Number of children: Not on file   Years of education: Not on file   Highest education level: Not on file  Occupational History   Not on file  Tobacco Use   Smoking status: Never   Smokeless tobacco: Never  Substance and Sexual Activity   Alcohol use: Never   Drug use: Not on file   Sexual activity: Not on file  Other Topics Concern   Not on file  Social History Narrative   Not on file   Social Determinants of Health   Financial Resource Strain: Not on file  Food Insecurity: Not on file  Transportation Needs: Not on file  Physical Activity: Not on file  Stress: Not on file  Social Connections: Not on file  Intimate Partner Violence: Not on file    Family History  Problem Relation Age of Onset   Nephrolithiasis Father        Objective: Vitals:   05/27/21 1403  BP: (!) 164/84  Pulse: (!) 58  Temp: 98.4 F (36.9 C)     Physical Exam  Lab Results:  Results for orders placed or performed in visit on 05/27/21 (from the past 24 hour(s))  Urinalysis, Routine w reflex microscopic     Status: Abnormal   Collection Time: 05/27/21  2:07 PM  Result Value Ref Range   Specific Gravity, UA 1.020 1.005 - 1.030   pH, UA 6.0 5.0 - 7.5   Color, UA Yellow Yellow   Appearance Ur Clear  Clear   Leukocytes,UA Negative Negative   Protein,UA Negative  Negative/Trace   Glucose, UA Negative Negative   Ketones, UA Negative Negative   RBC, UA Trace (A) Negative   Bilirubin, UA Negative Negative   Urobilinogen, Ur 0.2 0.2 - 1.0 mg/dL   Nitrite, UA Negative Negative   Microscopic Examination See below:    Narrative   Performed at:  [facility] [address], [place], [place]  [id number] Lab Director: Mina Marble MT, Phone:  [phone]  Microscopic Examination     Status: None   Collection Time: 05/27/21  2:07 PM   Urine  Result Value Ref Range   WBC, UA None seen 0 - 5 /hpf   RBC None seen 0 - 2 /hpf   Epithelial Cells (non renal) None seen 0 - 10 /hpf   Renal Epithel, UA None seen None seen /hpf   Bacteria, UA None seen None seen/Few   Narrative   Performed at:  [facility], [place], [place]  [id number] Lab Director: [place], Phone:  [phone]        Results for orders placed or performed in visit on 05/27/21 (from the past 24 hour(s))  Urinalysis, Routine w reflex microscopic     Status: Abnormal   Collection Time: 05/27/21  2:07 PM  Result Value Ref Range   Specific Gravity, UA 1.020 1.005 - 1.030   pH, UA 6.0 5.0 - 7.5   Color, UA Yellow Yellow   Appearance Ur Clear Clear   Leukocytes,UA Negative Negative   Protein,UA Negative Negative/Trace   Glucose, UA Negative Negative   Ketones, UA Negative Negative   RBC, UA Trace (A) Negative   Bilirubin, UA Negative Negative   Urobilinogen, Ur 0.2 0.2 - 1.0 mg/dL   Nitrite, UA Negative Negative   Microscopic Examination See below:    Narrative   Performed at:  [facility] [address], [place], [place]  [id number] Lab Director: Mina Marble MT, Phone:  [phone]  Microscopic Examination     Status: None   Collection Time: 05/27/21  2:07 PM   Urine  Result Value Ref Range   WBC, UA None seen 0 - 5 /hpf   RBC None seen 0 - 2 /hpf   Epithelial Cells (non renal) None seen 0 - 10 /hpf   Renal  Epithel, UA None seen None seen /hpf   Bacteria, UA None seen None seen/Few   Narrative   Performed at:  [facility], [place], [place]  [id number] Lab Director: Mina Marble MT, Phone:  [phone]     Recent Results (from the past 2160 hour(s))  Urinalysis, Routine w reflex microscopic     Status: Abnormal   Collection Time: 04/01/21  9:12 AM  Result Value Ref Range   Specific Gravity, UA CANCELED     Comment: Test not performed  Result canceled by the ancillary.    pH, UA CANCELED     Comment: Test not performed  Result canceled by the ancillary.    Color, UA Red (A) Yellow    Comment: Unable to read or assay due to color interference.   Appearance Ur Turbid (A) Clear   Protein,UA CANCELED     Comment: Test not performed  Result canceled by the ancillary.    Glucose, UA CANCELED     Comment: Test not performed  Result canceled by the ancillary.    Ketones, UA  CANCELED     Comment: Test not performed  Result canceled by the ancillary.    Microscopic Examination See below:   Microscopic Examination     Status: Abnormal   Collection Time: 04/01/21  9:12 AM   Urine  Result Value Ref Range   WBC, UA 11-30 (A) 0 - 5 /hpf   RBC >30 (A) 0 - 2 /hpf   Epithelial Cells (non renal) 0-10 0 - 10 /hpf   Bacteria, UA Moderate (A) None seen/Few  Bladder scan     Status: None   Collection Time: 04/01/21  9:30 AM  Result Value Ref Range   Scan Result 252   Urine Culture     Status: Abnormal   Collection Time: 04/01/21  9:41 AM   Specimen: Urine, Clean Catch   UR  Result Value Ref Range   Urine Culture, Routine Final report (A)    Organism ID, Bacteria Escherichia coli (A)     Comment: Cefazolin with an MIC <=16 predicts susceptibility to the oral agents cefaclor, cefdinir, cefpodoxime, cefprozil, cefuroxime, cephalexin, and loracarbef when used for therapy of uncomplicated urinary tract infections due to E. coli, Klebsiella pneumoniae, and  Proteus mirabilis. Greater than 100,000 colony forming units per mL    Antimicrobial Susceptibility Comment     Comment:       ** S = Susceptible; I = Intermediate; R = Resistant **                    P = Positive; N = Negative             MICS are expressed in micrograms per mL    Antibiotic                 RSLT#1    RSLT#2    RSLT#3    RSLT#4 Amoxicillin/Clavulanic Acid    I Ampicillin                     R Cefazolin                      S Cefepime                       S Ceftriaxone                    S Cefuroxime                     S Ciprofloxacin                  R Ertapenem                      S Gentamicin                     S Imipenem                       S Levofloxacin                   I Meropenem                      S Nitrofurantoin                 S Piperacillin/Tazobactam        S Tetracycline  S Tobramycin                     S Trimethoprim/Sulfa             S   Urinalysis, Routine w reflex microscopic     Status: Abnormal   Collection Time: 05/27/21  2:07 PM  Result Value Ref Range   Specific Gravity, UA 1.020 1.005 - 1.030   pH, UA 6.0 5.0 - 7.5   Color, UA Yellow Yellow   Appearance Ur Clear Clear   Leukocytes,UA Negative Negative   Protein,UA Negative Negative/Trace   Glucose, UA Negative Negative   Ketones, UA Negative Negative   RBC, UA Trace (A) Negative   Bilirubin, UA Negative Negative   Urobilinogen, Ur 0.2 0.2 - 1.0 mg/dL   Nitrite, UA Negative Negative   Microscopic Examination See below:   Microscopic Examination     Status: None   Collection Time: 05/27/21  2:07 PM   Urine  Result Value Ref Range   WBC, UA None seen 0 - 5 /hpf   RBC None seen 0 - 2 /hpf   Epithelial Cells (non renal) None seen 0 - 10 /hpf   Renal Epithel, UA None seen None seen /hpf   Bacteria, UA None seen None seen/Few   UA is grossly bloody and looks infected.   Studies/Results: Procedure:  Flexible cystoscopy  He was prepped with betadine  and 2% lidocaine jelly.  Cipro 500mg  po was given.  The scope was passed easily.  The urethra was normal.  External sphincter is intact.  The prostatic urethra is about 6cm with evidence of prior resection and friable mucosa in the resection channel.  There is no middle lobe.   The bladder has mild/mod trabeculation without tumors, stones or inflammations.  The UO's are normal.   There were no complications.     Assessment & Plan: Hemorrhagic cystitis with BPH and BOO.  His UA is clear after treatment and he is voiding better on finasteride.  He has a very large prostate with friable mucosa in the resection bed.   He will continue finasteride and f/u in 6 months.    Meds ordered this encounter  Medications   ciprofloxacin (CIPRO) tablet 500 mg     Orders Placed This Encounter  Procedures   Microscopic Examination   Urinalysis, Routine w reflex microscopic      Return in about 6 months (around 11/26/2021).   CC: [name], Virgina Evener, MD      Irine Seal 05/27/2021 Patient ID: Trixie Rude, male   DOB: Apr 19, 1951, 70 y.o.   MRN: [id number]

## 2021-07-28 DIAGNOSIS — E7849 Other hyperlipidemia: Secondary | ICD-10-CM | POA: Diagnosis not present

## 2021-07-28 DIAGNOSIS — I10 Essential (primary) hypertension: Secondary | ICD-10-CM | POA: Diagnosis not present

## 2021-07-28 DIAGNOSIS — K219 Gastro-esophageal reflux disease without esophagitis: Secondary | ICD-10-CM | POA: Diagnosis not present

## 2021-09-10 ENCOUNTER — Other Ambulatory Visit: Payer: Self-pay

## 2021-09-10 ENCOUNTER — Other Ambulatory Visit: Payer: Medicare Other

## 2021-09-10 DIAGNOSIS — R972 Elevated prostate specific antigen [PSA]: Secondary | ICD-10-CM

## 2021-09-11 LAB — PSA, TOTAL AND FREE
PSA, Free Pct: 20.5 %
PSA, Free: 0.88 ng/mL
Prostate Specific Ag, Serum: 4.3 ng/mL — ABNORMAL HIGH (ref 0.0–4.0)

## 2021-09-16 ENCOUNTER — Ambulatory Visit: Payer: Medicare Other | Admitting: Urology

## 2021-09-16 NOTE — Progress Notes (Signed)
Sent via mail 

## 2021-09-17 ENCOUNTER — Ambulatory Visit: Payer: Medicare Other | Admitting: Urology

## 2021-09-27 DIAGNOSIS — K219 Gastro-esophageal reflux disease without esophagitis: Secondary | ICD-10-CM | POA: Diagnosis not present

## 2021-09-27 DIAGNOSIS — E7849 Other hyperlipidemia: Secondary | ICD-10-CM | POA: Diagnosis not present

## 2021-09-27 DIAGNOSIS — I10 Essential (primary) hypertension: Secondary | ICD-10-CM | POA: Diagnosis not present

## 2021-11-04 DIAGNOSIS — E7849 Other hyperlipidemia: Secondary | ICD-10-CM | POA: Diagnosis not present

## 2021-11-04 DIAGNOSIS — I10 Essential (primary) hypertension: Secondary | ICD-10-CM | POA: Diagnosis not present

## 2021-11-04 DIAGNOSIS — K219 Gastro-esophageal reflux disease without esophagitis: Secondary | ICD-10-CM | POA: Diagnosis not present

## 2021-11-04 DIAGNOSIS — R7301 Impaired fasting glucose: Secondary | ICD-10-CM | POA: Diagnosis not present

## 2021-11-04 DIAGNOSIS — E782 Mixed hyperlipidemia: Secondary | ICD-10-CM | POA: Diagnosis not present

## 2021-11-10 DIAGNOSIS — K219 Gastro-esophageal reflux disease without esophagitis: Secondary | ICD-10-CM | POA: Diagnosis not present

## 2021-11-10 DIAGNOSIS — D126 Benign neoplasm of colon, unspecified: Secondary | ICD-10-CM | POA: Diagnosis not present

## 2021-11-10 DIAGNOSIS — Z23 Encounter for immunization: Secondary | ICD-10-CM | POA: Diagnosis not present

## 2021-11-10 DIAGNOSIS — N3091 Cystitis, unspecified with hematuria: Secondary | ICD-10-CM | POA: Diagnosis not present

## 2021-11-10 DIAGNOSIS — I10 Essential (primary) hypertension: Secondary | ICD-10-CM | POA: Diagnosis not present

## 2021-11-10 DIAGNOSIS — E7849 Other hyperlipidemia: Secondary | ICD-10-CM | POA: Diagnosis not present

## 2021-11-10 DIAGNOSIS — Z0001 Encounter for general adult medical examination with abnormal findings: Secondary | ICD-10-CM | POA: Diagnosis not present

## 2021-11-25 ENCOUNTER — Ambulatory Visit: Payer: Medicare Other | Admitting: Urology

## 2021-12-09 ENCOUNTER — Other Ambulatory Visit: Payer: Self-pay

## 2021-12-09 ENCOUNTER — Ambulatory Visit: Payer: Medicare Other | Admitting: Urology

## 2021-12-09 ENCOUNTER — Encounter: Payer: Self-pay | Admitting: Urology

## 2021-12-09 VITALS — BP 135/74 | HR 59 | Wt 295.0 lb

## 2021-12-09 DIAGNOSIS — N3021 Other chronic cystitis with hematuria: Secondary | ICD-10-CM

## 2021-12-09 DIAGNOSIS — N400 Enlarged prostate without lower urinary tract symptoms: Secondary | ICD-10-CM

## 2021-12-09 DIAGNOSIS — R351 Nocturia: Secondary | ICD-10-CM

## 2021-12-09 DIAGNOSIS — R972 Elevated prostate specific antigen [PSA]: Secondary | ICD-10-CM

## 2021-12-09 DIAGNOSIS — R310 Gross hematuria: Secondary | ICD-10-CM

## 2021-12-09 DIAGNOSIS — R3915 Urgency of urination: Secondary | ICD-10-CM

## 2021-12-09 LAB — MICROSCOPIC EXAMINATION: Renal Epithel, UA: NONE SEEN /hpf

## 2021-12-09 LAB — URINALYSIS, ROUTINE W REFLEX MICROSCOPIC
Bilirubin, UA: NEGATIVE
Glucose, UA: NEGATIVE
Ketones, UA: NEGATIVE
Nitrite, UA: NEGATIVE
Protein,UA: NEGATIVE
Specific Gravity, UA: 1.025 (ref 1.005–1.030)
Urobilinogen, Ur: 0.2 mg/dL (ref 0.2–1.0)
pH, UA: 6.5 (ref 5.0–7.5)

## 2021-12-09 NOTE — Progress Notes (Signed)
Subjective:  1. Benign prostatic hyperplasia without lower urinary tract symptoms   2. Elevated PSA   3. Nocturia   4. Chronic cystitis with hematuria       Benjamin Colon has a  history of UTI's, an elevated PSA and hematuria.  His PSA prior to this visit is 10.4 with a 19% f/t ratio.  His UA today is consistent with a UTI.  He has had some symptoms with mild dysuria over the last 2 months.  He has had intermittent hematuria.   He has previously had e. Coli on most of his cultures.   He has was on macrobid at one point in the past year. He had been on bactrim DS 1/2 po qhs several years ago with some benefit.   12/09/21:  Benjamin Colon returns today in f/u.  His PSA is 4.3 with a 20.5% f/t ratio. He remains on finasteride which was started in 5/22.  He is voiding well with an IPSS of 2.  He has had some transient cloudy urine but he has had no hematuria or symptomatic UTI's.   05/27/21: Benjamin Colon returns today in f/u.  He had e. Coli again on his culture on 04/01/21.  He passed a voiding trial on 04/05/21.  He was treated with keflex for the UTI.  He remains on finasteride.   He continues to void well with an IPSS of 2.  His UA today is clear.  He has had no side effects with the finasteride.   04/01/21: Benjamin Colon returns today in f/u with the onset Tuesday of gross hematuria which has been progressive and associated with marked irritative voiding symtoms and passage of clots.   He has had some fever about a week ago.  He has no flank pain.  He has suprapubic pressure.  His UA looked infected.   Prior to onset he had no voiding complaints.    GU Hx: [name] is sent in consultation by Dr. Pleas Koch for an elevated PSA of 9.7 in 10/18 which was up from 7.2 in 5/18. He had a negative biopsy in 8/16 by Dr. Exie Parody for a PSA os 10.2 in 3/16. He had a subsequent prostate MRI in 3/17 that showed a 6x22mm lesion at the right lateral base that could represent low grade cancer. He had another biopsy in 4/17 that was negative.  He has had 2 prior TURPs with the last in 2014. He has had the bleeding intermittently since then but had a cystoscopy in 2017. He has had a history of UTI's prior to the TURP but no symptomatic infections since. He has no other associated signs or symptoms.   His last PSA was 7.9 in the fall of 2020 which is reasonably stable over the last few years. He has a history of UTI's and an elevated PSA. He is no longer on macrobid for suppression. He has no dysuria. He has had intermittent hematuria that is long standing and he has an unusual sensation at the end of the stream.  He has had some intermittent terminal dysuria and occasional hematuria.         ROS:  ROS:  A complete review of systems was performed.  All systems are negative except for pertinent findings as noted.   Review of Systems  All other systems reviewed and are negative.  IPSS     Row Name 12/09/21 1400         International Prostate Symptom Score   How often have you had the sensation  of not emptying your bladder? Not at All     How often have you had to urinate less than every two hours? Not at All     How often have you found you stopped and started again several times when you urinated? Not at All     How often have you found it difficult to postpone urination? Not at All     How often have you had a weak urinary stream? Not at All     How often have you had to strain to start urination? Not at All     How many times did you typically get up at night to urinate? 2 Times     Total IPSS Score 2       Quality of Life due to urinary symptoms   If you were to spend the rest of your life with your urinary condition just the way it is now how would you feel about that? Pleased                 Allergies  Allergen Reactions   Fosinopril Cough    Outpatient Encounter Medications as of 12/09/2021  Medication Sig   atorvastatin (LIPITOR) 20 MG tablet Take 20 mg by mouth daily.   finasteride (PROSCAR) 5 MG  tablet Take 1 tablet (5 mg total) by mouth daily.   hydrochlorothiazide (HYDRODIURIL) 12.5 MG tablet Take 12.5 mg by mouth daily.   losartan (COZAAR) 100 MG tablet Take 100 mg by mouth daily.   omeprazole (PRILOSEC) 20 MG capsule Take 20 mg by mouth daily.   No facility-administered encounter medications on file as of 12/09/2021.    Past Medical History:  Diagnosis Date   Anxiety    Asbestosis ([place])    GERD (gastroesophageal reflux disease)    Hypercholesteremia    Hypertension     Past Surgical History:  Procedure Laterality Date   PROSTATE BIOPSY  02/2016   PROSTATE BIOPSY  06/2015   TRANSURETHRAL RESECTION OF PROSTATE  2014   He had one prior as well   VIDEO ASSISTED THORACOSCOPY (VATS)/DECORTICATION Left 2006    Social History   Socioeconomic History   Marital status: Married    Spouse name: Not on file   Number of children: Not on file   Years of education: Not on file   Highest education level: Not on file  Occupational History   Not on file  Tobacco Use   Smoking status: Never   Smokeless tobacco: Never  Substance and Sexual Activity   Alcohol use: Never   Drug use: Not on file   Sexual activity: Not on file  Other Topics Concern   Not on file  Social History Narrative   Not on file   Social Determinants of Health   Financial Resource Strain: Not on file  Food Insecurity: Not on file  Transportation Needs: Not on file  Physical Activity: Not on file  Stress: Not on file  Social Connections: Not on file  Intimate Partner Violence: Not on file    Family History  Problem Relation Age of Onset   Nephrolithiasis Father        Objective: Vitals:   12/09/21 1444  BP: 135/74  Pulse: (!) 59     Physical Exam  Lab Results:  Results for orders placed or performed in visit on 12/09/21 (from the past 24 hour(s))  Urinalysis, Routine w reflex microscopic     Status: Abnormal   Collection Time: 12/09/21  3:49  PM  Result Value Ref Range    Specific Gravity, UA 1.025 1.005 - 1.030   pH, UA 6.5 5.0 - 7.5   Color, UA Amber (A) Yellow   Appearance Ur Cloudy (A) Clear   Leukocytes,UA 1+ (A) Negative   Protein,UA Negative Negative/Trace   Glucose, UA Negative Negative   Ketones, UA Negative Negative   RBC, UA Trace (A) Negative   Bilirubin, UA Negative Negative   Urobilinogen, Ur 0.2 0.2 - 1.0 mg/dL   Nitrite, UA Negative Negative   Microscopic Examination See below:    Narrative   Performed at:  [facility] [address], [place], [place]  [id number] Lab Director: Mina Marble MT, Phone:  [phone]  Microscopic Examination     Status: Abnormal   Collection Time: 12/09/21  3:49 PM   Urine  Result Value Ref Range   WBC, UA 11-30 (A) 0 - 5 /hpf   RBC 0-2 0 - 2 /hpf   Epithelial Cells (non renal) 0-10 0 - 10 /hpf   Renal Epithel, UA None seen None seen /hpf   Bacteria, UA Few None seen/Few   Narrative   Performed at:  [facility] [address] [place], [place]  [id number] Lab Director: Mina Marble MT, Phone:  [phone]                 Studies/Results:     Assessment & Plan: Hx of chronic hemorrhagic cystitis with BPH and BOO.  He has been doing well on finatesteride with no further hematuria or symptomatic UTI's.  Elevated PSA.  His PSA has fallen appropriately with finasteride.     No orders of the defined types were placed in this encounter.    Orders Placed This Encounter  Procedures   Microscopic Examination   Urinalysis, Routine w reflex microscopic   PSA, total and free    Standing Status:   Future    Standing Expiration Date:   12/09/2022      Return in about 6 months (around 06/08/2022) for with PSA.   CC: [name], Virgina Evener, MD      Irine Seal 12/10/2021 Patient ID: Trixie Rude, male   DOB: 13-Aug-1951, 71 y.o.   MRN: [id number] Patient ID: SHANTE MAYSONET, male   DOB: [birth year], 71 y.o.   MRN: [id number]

## 2021-12-09 NOTE — Progress Notes (Signed)
Urological Symptom Review  Patient is experiencing the following symptoms: Get up at night to urinate   Review of Systems  Gastrointestinal (upper)  : Indigestion/heartburn  Gastrointestinal (lower) : Negative for lower GI symptoms  Constitutional : Negative for symptoms  Skin: Negative for skin symptoms  Eyes: Negative for eye symptoms  Ear/Nose/Throat : Negative for Ear/Nose/Throat symptoms  Hematologic/Lymphatic: Negative for Hematologic/Lymphatic symptoms  Cardiovascular : Negative for cardiovascular symptoms  Respiratory : Negative for respiratory symptoms  Endocrine: Negative for endocrine symptoms  Musculoskeletal: Joint pain  Neurological: Negative for neurological symptoms  Psychologic: Negative for psychiatric symptoms

## 2022-03-08 DIAGNOSIS — R6889 Other general symptoms and signs: Secondary | ICD-10-CM | POA: Diagnosis not present

## 2022-05-02 DIAGNOSIS — E7849 Other hyperlipidemia: Secondary | ICD-10-CM | POA: Diagnosis not present

## 2022-05-02 DIAGNOSIS — Z0001 Encounter for general adult medical examination with abnormal findings: Secondary | ICD-10-CM | POA: Diagnosis not present

## 2022-05-02 DIAGNOSIS — K219 Gastro-esophageal reflux disease without esophagitis: Secondary | ICD-10-CM | POA: Diagnosis not present

## 2022-05-02 DIAGNOSIS — R7301 Impaired fasting glucose: Secondary | ICD-10-CM | POA: Diagnosis not present

## 2022-05-02 DIAGNOSIS — I10 Essential (primary) hypertension: Secondary | ICD-10-CM | POA: Diagnosis not present

## 2022-05-02 DIAGNOSIS — G4733 Obstructive sleep apnea (adult) (pediatric): Secondary | ICD-10-CM | POA: Diagnosis not present

## 2022-05-02 DIAGNOSIS — E782 Mixed hyperlipidemia: Secondary | ICD-10-CM | POA: Diagnosis not present

## 2022-05-05 DIAGNOSIS — R55 Syncope and collapse: Secondary | ICD-10-CM | POA: Diagnosis not present

## 2022-05-05 DIAGNOSIS — G4733 Obstructive sleep apnea (adult) (pediatric): Secondary | ICD-10-CM | POA: Diagnosis not present

## 2022-05-05 DIAGNOSIS — I10 Essential (primary) hypertension: Secondary | ICD-10-CM | POA: Diagnosis not present

## 2022-05-05 DIAGNOSIS — E7849 Other hyperlipidemia: Secondary | ICD-10-CM | POA: Diagnosis not present

## 2022-05-19 DIAGNOSIS — R42 Dizziness and giddiness: Secondary | ICD-10-CM | POA: Diagnosis not present

## 2022-05-19 DIAGNOSIS — R55 Syncope and collapse: Secondary | ICD-10-CM | POA: Diagnosis not present

## 2022-05-26 ENCOUNTER — Other Ambulatory Visit: Payer: Medicare Other

## 2022-05-26 DIAGNOSIS — R972 Elevated prostate specific antigen [PSA]: Secondary | ICD-10-CM

## 2022-05-27 LAB — PSA, TOTAL AND FREE
PSA, Free Pct: 20.7 %
PSA, Free: 0.91 ng/mL
Prostate Specific Ag, Serum: 4.4 ng/mL — ABNORMAL HIGH (ref 0.0–4.0)

## 2022-06-02 ENCOUNTER — Ambulatory Visit: Payer: Medicare Other | Admitting: Urology

## 2022-06-09 ENCOUNTER — Encounter: Payer: Self-pay | Admitting: Urology

## 2022-06-09 ENCOUNTER — Ambulatory Visit (INDEPENDENT_AMBULATORY_CARE_PROVIDER_SITE_OTHER): Payer: Medicare Other | Admitting: Urology

## 2022-06-09 VITALS — BP 132/68 | HR 58

## 2022-06-09 DIAGNOSIS — R972 Elevated prostate specific antigen [PSA]: Secondary | ICD-10-CM

## 2022-06-09 DIAGNOSIS — R351 Nocturia: Secondary | ICD-10-CM

## 2022-06-09 DIAGNOSIS — N3021 Other chronic cystitis with hematuria: Secondary | ICD-10-CM | POA: Diagnosis not present

## 2022-06-09 DIAGNOSIS — N401 Enlarged prostate with lower urinary tract symptoms: Secondary | ICD-10-CM

## 2022-06-09 LAB — URINALYSIS, ROUTINE W REFLEX MICROSCOPIC
Bilirubin, UA: NEGATIVE
Glucose, UA: NEGATIVE
Ketones, UA: NEGATIVE
Nitrite, UA: NEGATIVE
Protein,UA: NEGATIVE
Specific Gravity, UA: 1.02 (ref 1.005–1.030)
Urobilinogen, Ur: 0.2 mg/dL (ref 0.2–1.0)
pH, UA: 6 (ref 5.0–7.5)

## 2022-06-09 LAB — MICROSCOPIC EXAMINATION: Renal Epithel, UA: NONE SEEN /hpf

## 2022-06-09 MED ORDER — FINASTERIDE 5 MG PO TABS: 5.0000 mg | ORAL_TABLET | Freq: Every day | ORAL | 3 refills | Status: DC

## 2022-06-09 NOTE — Progress Notes (Signed)
Subjective:  1. Elevated PSA   2. Benign prostatic hyperplasia with lower urinary tract symptoms, symptom details unspecified   3. Chronic cystitis with hematuria   4. Nocturia       Benjamin Colon has a  history of UTI's, an elevated PSA and hematuria.  His PSA prior to this visit is 10.4 with a 19% f/t ratio.  His UA today is consistent with a UTI.  He has had some symptoms with mild dysuria over the last 2 months.  He has had intermittent hematuria.   He has previously had e. Coli on most of his cultures.   He has was on macrobid at one point in the past year. He had been on bactrim DS 1/2 po qhs several years ago with some benefit.   06/09/22: Benjamin Colon returns today in f/u.  His PSA is stable at 4.4 with a 20.7% f/t ratio.  He is voiding well on finasteride with an IPSS of 1 and nocturia x 1.  He has had some cloudy urine but it has cleared.  He has had no hematuria or dysuria.    12/09/21:  Benjamin Colon returns today in f/u.  His PSA is 4.3 with a 20.5% f/t ratio. He remains on finasteride which was started in 5/22.  He is voiding well with an IPSS of 2.  He has had some transient cloudy urine but he has had no hematuria or symptomatic UTI's.   05/27/21: Benjamin Colon returns today in f/u.  He had e. Coli again on his culture on 04/01/21.  He passed a voiding trial on 04/05/21.  He was treated with keflex for the UTI.  He remains on finasteride.   He continues to void well with an IPSS of 2.  His UA today is clear.  He has had no side effects with the finasteride.   04/01/21: Benjamin Colon returns today in f/u with the onset Tuesday of gross hematuria which has been progressive and associated with marked irritative voiding symtoms and passage of clots.   He has had some fever about a week ago.  He has no flank pain.  He has suprapubic pressure.  His UA looked infected.   Prior to onset he had no voiding complaints.    GU Hx: Benjamin Colon is sent in consultation by Dr. Pleas Koch for an elevated PSA of 9.7 in 10/18 which was  up from 7.2 in 5/18. He had a negative biopsy in 8/16 by Dr. Exie Parody for a PSA os 10.2 in 3/16. He had a subsequent prostate MRI in 3/17 that showed a 6x30m lesion at the right lateral base that could represent low grade cancer. He had another biopsy in 4/17 that was negative. He has had 2 prior TURPs with the last in 2014. He has had the bleeding intermittently since then but had a cystoscopy in 2017. He has had a history of UTI's prior to the TURP but no symptomatic infections since. He has no other associated signs or symptoms.   His last PSA was 7.9 in the fall of 2020 which is reasonably stable over the last few years. He has a history of UTI's and an elevated PSA. He is no longer on macrobid for suppression. He has no dysuria. He has had intermittent hematuria that is long standing and he has an unusual sensation at the end of the stream.  He has had some intermittent terminal dysuria and occasional hematuria.         ROS:  ROS:  A complete review  of systems was performed.  All systems are negative except for pertinent findings as noted.   Review of Systems  Gastrointestinal:  Positive for heartburn.  All other systems reviewed and are negative.   IPSS     Row Name 06/09/22 1500         International Prostate Symptom Score   How often have you had the sensation of not emptying your bladder? Not at All     How often have you had to urinate less than every two hours? Not at All     How often have you found you stopped and started again several times when you urinated? Not at All     How often have you found it difficult to postpone urination? Not at All     How often have you had a weak urinary stream? Not at All     How often have you had to strain to start urination? Not at All     How many times did you typically get up at night to urinate? 1 Time     Total IPSS Score 1       Quality of Life due to urinary symptoms   If you were to spend the rest of your life with your urinary  condition just the way it is now how would you feel about that? Mostly Satisfied                 Allergies  Allergen Reactions   Fosinopril Cough    Outpatient Encounter Medications as of 06/09/2022  Medication Sig   atorvastatin (LIPITOR) 20 MG tablet Take 20 mg by mouth daily.   finasteride (PROSCAR) 5 MG tablet Take 1 tablet (5 mg total) by mouth daily.   hydrochlorothiazide (HYDRODIURIL) 12.5 MG tablet Take 12.5 mg by mouth daily.   losartan (COZAAR) 100 MG tablet Take 100 mg by mouth daily.   omeprazole (PRILOSEC) 20 MG capsule Take 20 mg by mouth daily.   [DISCONTINUED] finasteride (PROSCAR) 5 MG tablet Take 1 tablet (5 mg total) by mouth daily.   No facility-administered encounter medications on file as of 06/09/2022.    Past Medical History:  Diagnosis Date   Anxiety    Asbestosis ([place])    GERD (gastroesophageal reflux disease)    Hypercholesteremia    Hypertension     Past Surgical History:  Procedure Laterality Date   PROSTATE BIOPSY  02/2016   PROSTATE BIOPSY  06/2015   TRANSURETHRAL RESECTION OF PROSTATE  2014   He had one prior as well   VIDEO ASSISTED THORACOSCOPY (VATS)/DECORTICATION Left 2006    Social History   Socioeconomic History   Marital status: Married    Spouse name: Not on file   Number of children: Not on file   Years of education: Not on file   Highest education level: Not on file  Occupational History   Not on file  Tobacco Use   Smoking status: Never   Smokeless tobacco: Never  Substance and Sexual Activity   Alcohol use: Never   Drug use: Not on file   Sexual activity: Not on file  Other Topics Concern   Not on file  Social History Narrative   Not on file   Social Determinants of Health   Financial Resource Strain: Not on file  Food Insecurity: Not on file  Transportation Needs: Not on file  Physical Activity: Not on file  Stress: Not on file  Social Connections: Not on file  Intimate  Partner Violence: Not on file     Family History  Problem Relation Age of Onset   Nephrolithiasis Father        Objective: Vitals:   06/09/22 1514  BP: 132/68  Pulse: (!) 58     Physical Exam Vitals reviewed.  Constitutional:      Appearance: Normal appearance. He is obese.  Genitourinary:    Comments: AP without lesions. NST wtihout mass. Prostate is 3+ benign. SV non-palpable.  Neurological:     Mental Status: He is alert.     Lab Results:  Results for orders placed or performed in visit on 06/09/22 (from the past 24 hour(s))  Urinalysis, Routine w reflex microscopic     Status: Abnormal   Collection Time: 06/09/22  3:17 PM  Result Value Ref Range   Specific Gravity, UA 1.020 1.005 - 1.030   pH, UA 6.0 5.0 - 7.5   Color, UA Yellow Yellow   Appearance Ur Clear Clear   Leukocytes,UA Trace (A) Negative   Protein,UA Negative Negative/Trace   Glucose, UA Negative Negative   Ketones, UA Negative Negative   RBC, UA Trace (A) Negative   Bilirubin, UA Negative Negative   Urobilinogen, Ur 0.2 0.2 - 1.0 mg/dL   Nitrite, UA Negative Negative   Microscopic Examination See below:    Narrative   Performed at:  [facility] [address], [place], [place]  [id number] Lab Director: Mina Marble MT, Phone:  [phone]  Microscopic Examination     Status: Abnormal   Collection Time: 06/09/22  3:17 PM   Urine  Result Value Ref Range   WBC, UA 6-10 (A) 0 - 5 /hpf   RBC, Urine 0-2 0 - 2 /hpf   Epithelial Cells (non renal) 0-10 0 - 10 /hpf   Renal Epithel, UA None seen None seen /hpf   Bacteria, UA Few (A) None seen/Few   Narrative   Performed at:  [facility] [address] [place], [place]  [id number] Lab Director: Shell, Phone:  [phone]   UA has 6-10 WBC and few bacteria.  Lab Results  Component Value Date   PSA1 4.4 (H) 05/26/2022   PSA1 4.3 (H) 09/10/2021   PSA1 10.4 (H) 09/08/2020    Studies/Results:     Assessment & Plan: Hx of  chronic hemorrhagic cystitis with BPH and BOO.  He has been doing well on finatesteride with no further hematuria or symptomatic UTI's.  Elevated PSA.  His PSA has fallen appropriately with finasteride.     Meds ordered this encounter  Medications   finasteride (PROSCAR) 5 MG tablet    Sig: Take 1 tablet (5 mg total) by mouth daily.    Dispense:  90 tablet    Refill:  3     Orders Placed This Encounter  Procedures   Microscopic Examination   Urinalysis, Routine w reflex microscopic   PSA, total and free    Standing Status:   Future    Standing Expiration Date:   06/10/2023      Return in about 1 year (around 06/10/2023) for with PSA.   CC: [name], Virgina Evener, MD      Irine Seal 06/10/2022 Patient ID: Trixie Rude, male   DOB: 1951-08-25, 71 y.o.   MRN: [id number] Patient ID: OTTIS VACHA, male   DOB: 12-21-1950, 71 y.o.   MRN: [id number]

## 2022-09-19 DIAGNOSIS — R6889 Other general symptoms and signs: Secondary | ICD-10-CM | POA: Diagnosis not present

## 2022-10-26 DIAGNOSIS — S76311A Strain of muscle, fascia and tendon of the posterior muscle group at thigh level, right thigh, initial encounter: Secondary | ICD-10-CM | POA: Diagnosis not present

## 2022-10-28 DIAGNOSIS — E7849 Other hyperlipidemia: Secondary | ICD-10-CM | POA: Diagnosis not present

## 2022-10-28 DIAGNOSIS — K219 Gastro-esophageal reflux disease without esophagitis: Secondary | ICD-10-CM | POA: Diagnosis not present

## 2022-10-28 DIAGNOSIS — E039 Hypothyroidism, unspecified: Secondary | ICD-10-CM | POA: Diagnosis not present

## 2022-10-28 DIAGNOSIS — I10 Essential (primary) hypertension: Secondary | ICD-10-CM | POA: Diagnosis not present

## 2022-10-28 DIAGNOSIS — R5383 Other fatigue: Secondary | ICD-10-CM | POA: Diagnosis not present

## 2022-11-03 DIAGNOSIS — H6121 Impacted cerumen, right ear: Secondary | ICD-10-CM | POA: Diagnosis not present

## 2022-11-03 DIAGNOSIS — Z23 Encounter for immunization: Secondary | ICD-10-CM | POA: Diagnosis not present

## 2022-11-03 DIAGNOSIS — I10 Essential (primary) hypertension: Secondary | ICD-10-CM | POA: Diagnosis not present

## 2022-11-03 DIAGNOSIS — N3091 Cystitis, unspecified with hematuria: Secondary | ICD-10-CM | POA: Diagnosis not present

## 2022-11-03 DIAGNOSIS — G4733 Obstructive sleep apnea (adult) (pediatric): Secondary | ICD-10-CM | POA: Diagnosis not present

## 2022-11-03 DIAGNOSIS — Z0001 Encounter for general adult medical examination with abnormal findings: Secondary | ICD-10-CM | POA: Diagnosis not present

## 2022-11-03 DIAGNOSIS — E7849 Other hyperlipidemia: Secondary | ICD-10-CM | POA: Diagnosis not present

## 2022-11-03 DIAGNOSIS — S76311A Strain of muscle, fascia and tendon of the posterior muscle group at thigh level, right thigh, initial encounter: Secondary | ICD-10-CM | POA: Diagnosis not present

## 2023-04-27 DIAGNOSIS — K219 Gastro-esophageal reflux disease without esophagitis: Secondary | ICD-10-CM | POA: Diagnosis not present

## 2023-04-27 DIAGNOSIS — R7301 Impaired fasting glucose: Secondary | ICD-10-CM | POA: Diagnosis not present

## 2023-04-27 DIAGNOSIS — I10 Essential (primary) hypertension: Secondary | ICD-10-CM | POA: Diagnosis not present

## 2023-04-27 DIAGNOSIS — E039 Hypothyroidism, unspecified: Secondary | ICD-10-CM | POA: Diagnosis not present

## 2023-04-27 DIAGNOSIS — E7849 Other hyperlipidemia: Secondary | ICD-10-CM | POA: Diagnosis not present

## 2023-04-27 DIAGNOSIS — R5383 Other fatigue: Secondary | ICD-10-CM | POA: Diagnosis not present

## 2023-05-08 DIAGNOSIS — E7849 Other hyperlipidemia: Secondary | ICD-10-CM | POA: Diagnosis not present

## 2023-05-08 DIAGNOSIS — G4733 Obstructive sleep apnea (adult) (pediatric): Secondary | ICD-10-CM | POA: Diagnosis not present

## 2023-05-08 DIAGNOSIS — N3091 Cystitis, unspecified with hematuria: Secondary | ICD-10-CM | POA: Diagnosis not present

## 2023-05-08 DIAGNOSIS — I10 Essential (primary) hypertension: Secondary | ICD-10-CM | POA: Diagnosis not present

## 2023-05-09 DIAGNOSIS — E7849 Other hyperlipidemia: Secondary | ICD-10-CM | POA: Diagnosis not present

## 2023-05-09 DIAGNOSIS — G4733 Obstructive sleep apnea (adult) (pediatric): Secondary | ICD-10-CM | POA: Diagnosis not present

## 2023-05-09 DIAGNOSIS — Z0001 Encounter for general adult medical examination with abnormal findings: Secondary | ICD-10-CM | POA: Diagnosis not present

## 2023-05-09 DIAGNOSIS — I10 Essential (primary) hypertension: Secondary | ICD-10-CM | POA: Diagnosis not present

## 2023-06-02 ENCOUNTER — Other Ambulatory Visit: Payer: Medicare Other

## 2023-06-05 ENCOUNTER — Other Ambulatory Visit: Payer: Medicare Other

## 2023-06-05 DIAGNOSIS — R972 Elevated prostate specific antigen [PSA]: Secondary | ICD-10-CM

## 2023-06-06 LAB — PSA, TOTAL AND FREE
PSA, Free Pct: 28.3 %
PSA, Free: 0.82 ng/mL
Prostate Specific Ag, Serum: 2.9 ng/mL (ref 0.0–4.0)

## 2023-06-08 ENCOUNTER — Ambulatory Visit: Payer: Medicare Other | Admitting: Urology

## 2023-07-04 ENCOUNTER — Other Ambulatory Visit: Payer: Self-pay | Admitting: Urology

## 2023-07-04 DIAGNOSIS — N401 Enlarged prostate with lower urinary tract symptoms: Secondary | ICD-10-CM

## 2023-07-13 ENCOUNTER — Telehealth: Payer: Self-pay

## 2023-07-13 NOTE — Telephone Encounter (Signed)
Patient left voice message for prescription refilled Finasteride 5 mg. Patient states he was told there was a time limit on medication and prior authorization may be needed. Patient is aware I will contact pharmacy regarding prescription. Pharmacy staff verified medication is ready for pick up. Patient is made aware and voiced understanding.

## 2023-07-20 ENCOUNTER — Ambulatory Visit: Payer: Medicare Other | Admitting: Urology

## 2023-07-20 ENCOUNTER — Encounter: Payer: Self-pay | Admitting: Urology

## 2023-07-20 VITALS — BP 145/62 | HR 59

## 2023-07-20 DIAGNOSIS — Z87440 Personal history of urinary (tract) infections: Secondary | ICD-10-CM | POA: Diagnosis not present

## 2023-07-20 DIAGNOSIS — N138 Other obstructive and reflux uropathy: Secondary | ICD-10-CM | POA: Diagnosis not present

## 2023-07-20 DIAGNOSIS — N3021 Other chronic cystitis with hematuria: Secondary | ICD-10-CM

## 2023-07-20 DIAGNOSIS — R972 Elevated prostate specific antigen [PSA]: Secondary | ICD-10-CM

## 2023-07-20 DIAGNOSIS — R351 Nocturia: Secondary | ICD-10-CM

## 2023-07-20 DIAGNOSIS — N401 Enlarged prostate with lower urinary tract symptoms: Secondary | ICD-10-CM

## 2023-07-20 LAB — MICROSCOPIC EXAMINATION: WBC, UA: >30 /hpf — AB (ref 0–5)

## 2023-07-20 LAB — URINALYSIS, ROUTINE W REFLEX MICROSCOPIC
Bilirubin, UA: NEGATIVE
Glucose, UA: NEGATIVE
Ketones, UA: NEGATIVE
Nitrite, UA: NEGATIVE
Protein,UA: NEGATIVE
Specific Gravity, UA: 1.03 (ref 1.005–1.030)
Urobilinogen, Ur: 0.2 mg/dL (ref 0.2–1.0)
pH, UA: 6 (ref 5.0–7.5)

## 2023-07-20 NOTE — Progress Notes (Signed)
Subjective:  1. Elevated PSA   2. Benign prostatic hyperplasia with lower urinary tract symptoms, symptom details unspecified   3. Nocturia   4. Chronic cystitis with hematuria       Mr. Graeser has a  history of UTI's, an elevated PSA and hematuria.  His PSA prior to this visit is 10.4 with a 19% f/t ratio.  His UA today is consistent with a UTI.  He has had some symptoms with mild dysuria over the last 2 months.  He has had intermittent hematuria.   He has previously had e. Coli on most of his cultures.   He has was on macrobid at one point in the past year. He had been on bactrim DS 1/2 po qhs several years ago with some benefit.   07/20/23: Calan returns today in f/u.  He has a history of BPH with BOO and an elevated PSA that is down to 2.9 with a 28% f/t ratio on finasteride.   He may have had 1 UTI over the past year.  He has no UTI symptoms but he has persistent pyuria and bacteriuria with e. Coli on prior cultures.     06/09/22: Suhayb returns today in f/u.  His PSA is stable at 4.4 with a 20.7% f/t ratio.  He is voiding well on finasteride with an IPSS of 1 and nocturia x 1.  He has had some cloudy urine but it has cleared.  He has had no hematuria or dysuria.    12/09/21:  Wentworth returns today in f/u.  His PSA is 4.3 with a 20.5% f/t ratio. He remains on finasteride which was started in 5/22.  He is voiding well with an IPSS of 2.  He has had some transient cloudy urine but he has had no hematuria or symptomatic UTI's.   05/27/21: Gerlene Burdock returns today in f/u.  He had e. Coli again on his culture on 04/01/21.  He passed a voiding trial on 04/05/21.  He was treated with keflex for the UTI.  He remains on finasteride.   He continues to void well with an IPSS of 2.  His UA today is clear.  He has had no side effects with the finasteride.   04/01/21: Dyllan returns today in f/u with the onset Tuesday of gross hematuria which has been progressive and associated with marked irritative voiding  symtoms and passage of clots.   He has had some fever about a week ago.  He has no flank pain.  He has suprapubic pressure.  His UA looked infected.   Prior to onset he had no voiding complaints.    GU Hx: Mr. Morten is sent in consultation by Dr. Leandrew Koyanagi for an elevated PSA of 9.7 in 10/18 which was up from 7.2 in 5/18. He had a negative biopsy in 8/16 by Dr. Nechama Guard for a PSA os 10.2 in 3/16. He had a subsequent prostate MRI in 3/17 that showed a 6x28mm lesion at the right lateral base that could represent low grade cancer. He had another biopsy in 4/17 that was negative. He has had 2 prior TURPs with the last in 2014. He has had the bleeding intermittently since then but had a cystoscopy in 2017. He has had a history of UTI's prior to the TURP but no symptomatic infections since. He has no other associated signs or symptoms.   His last PSA was 7.9 in the fall of 2020 which is reasonably stable over the last few years. He has a  history of UTI's and an elevated PSA. He is no longer on macrobid for suppression. He has no dysuria. He has had intermittent hematuria that is long standing and he has an unusual sensation at the end of the stream.  He has had some intermittent terminal dysuria and occasional hematuria.         ROS:  ROS:  A complete review of systems was performed.  All systems are negative except for pertinent findings as noted.   Review of Systems  Gastrointestinal:  Positive for heartburn.  All other systems reviewed and are negative.   IPSS     Row Name 07/20/23 1400         International Prostate Symptom Score   How often have you had the sensation of not emptying your bladder? Not at All     How often have you had to urinate less than every two hours? Less than 1 in 5 times     How often have you found you stopped and started again several times when you urinated? Not at All     How often have you found it difficult to postpone urination? Not at All     How often have  you had a weak urinary stream? Not at All     How often have you had to strain to start urination? Not at All     How many times did you typically get up at night to urinate? 2 Times     Total IPSS Score 3       Quality of Life due to urinary symptoms   If you were to spend the rest of your life with your urinary condition just the way it is now how would you feel about that? Delighted                  Allergies  Allergen Reactions   Fosinopril Cough    Outpatient Encounter Medications as of 07/20/2023  Medication Sig   atorvastatin (LIPITOR) 20 MG tablet Take 20 mg by mouth daily.   finasteride (PROSCAR) 5 MG tablet TAKE 1 TABLET (5 MG TOTAL) BY MOUTH DAILY.   hydrochlorothiazide (HYDRODIURIL) 12.5 MG tablet Take 12.5 mg by mouth daily.   losartan (COZAAR) 100 MG tablet Take 100 mg by mouth daily.   omeprazole (PRILOSEC) 20 MG capsule Take 20 mg by mouth daily.   No facility-administered encounter medications on file as of 07/20/2023.    Past Medical History:  Diagnosis Date   Anxiety    Asbestosis ([facility])    GERD (gastroesophageal reflux disease)    Hypercholesteremia    Hypertension     Past Surgical History:  Procedure Laterality Date   PROSTATE BIOPSY  02/2016   PROSTATE BIOPSY  06/2015   TRANSURETHRAL RESECTION OF PROSTATE  2014   He had one prior as well   VIDEO ASSISTED THORACOSCOPY (VATS)/DECORTICATION Left 2006    Social History   Socioeconomic History   Marital status: Married    Spouse name: Not on file   Number of children: Not on file   Years of education: Not on file   Highest education level: Not on file  Occupational History   Not on file  Tobacco Use   Smoking status: Never   Smokeless tobacco: Never  Substance and Sexual Activity   Alcohol use: Never   Drug use: Not on file   Sexual activity: Not on file  Other Topics Concern   Not on file  Social  History Narrative   Not on file   Social Determinants of Health   Financial  Resource Strain: Not on file  Food Insecurity: Not on file  Transportation Needs: Not on file  Physical Activity: Not on file  Stress: Not on file  Social Connections: Unknown (04/10/2022)   [facility]   Social Network    Social Network: Not on file  Intimate Partner Violence: Unknown (03/02/2022)   Received from [facility]   HITS    Physically Hurt: Not on file    Insult or Talk Down To: Not on file    Threaten Physical Harm: Not on file    Scream or Curse: Not on file    Family History  Problem Relation Age of Onset   Nephrolithiasis Father        Objective: Vitals:   07/20/23 1430  BP: (!) 145/62  Pulse: (!) 59     Physical Exam Vitals reviewed.  Constitutional:      Appearance: Normal appearance. He is obese.  Neurological:     Mental Status: He is alert.     Lab Results:  No results found for this or any previous visit (from the past 24 hour(s)).  UA has >30 WBC, 3-10 RBC's  and mod bacteria.  Lab Results  Component Value Date   PSA1 2.9 06/05/2023   PSA1 4.4 (H) 05/26/2022   PSA1 4.3 (H) 09/10/2021    Studies/Results:     Assessment & Plan: Hx of chronic hemorrhagic cystitis with BPH and BOO.  He has been doing well on finatesteride with no further hematuria or symptomatic UTI's but his UA looks colonized today.  I will get a culture. .  Elevated PSA.  His PSA has fallen appropriately with finasteride.     No orders of the defined types were placed in this encounter.    Orders Placed This Encounter  Procedures   Microscopic Examination   Urinalysis, Routine w reflex microscopic   PSA, total and free    Standing Status:   Future    Standing Expiration Date:   07/19/2024      Return in about 1 year (around 07/19/2024) for with PSA.   CC: Juliette Alcide, MD      Bjorn Pippin 07/24/2023 Patient ID: Cyndy Freeze, male   DOB: [birth year], 72 y.o.   MRN: [id number] Patient ID: RIYAD BRINSER, male   DOB:  09-Apr-1951, 72 y.o.   MRN: [id number]

## 2023-11-02 DIAGNOSIS — E559 Vitamin D deficiency, unspecified: Secondary | ICD-10-CM | POA: Diagnosis not present

## 2023-11-02 DIAGNOSIS — Z0001 Encounter for general adult medical examination with abnormal findings: Secondary | ICD-10-CM | POA: Diagnosis not present

## 2023-11-02 DIAGNOSIS — I10 Essential (primary) hypertension: Secondary | ICD-10-CM | POA: Diagnosis not present

## 2023-11-02 DIAGNOSIS — E7849 Other hyperlipidemia: Secondary | ICD-10-CM | POA: Diagnosis not present

## 2023-11-02 DIAGNOSIS — R7301 Impaired fasting glucose: Secondary | ICD-10-CM | POA: Diagnosis not present

## 2023-11-09 DIAGNOSIS — I10 Essential (primary) hypertension: Secondary | ICD-10-CM | POA: Diagnosis not present

## 2023-11-09 DIAGNOSIS — Z7189 Other specified counseling: Secondary | ICD-10-CM | POA: Diagnosis not present

## 2023-11-09 DIAGNOSIS — Z23 Encounter for immunization: Secondary | ICD-10-CM | POA: Diagnosis not present

## 2023-11-09 DIAGNOSIS — Z0001 Encounter for general adult medical examination with abnormal findings: Secondary | ICD-10-CM | POA: Diagnosis not present

## 2023-11-09 DIAGNOSIS — E7849 Other hyperlipidemia: Secondary | ICD-10-CM | POA: Diagnosis not present

## 2023-11-09 DIAGNOSIS — G4733 Obstructive sleep apnea (adult) (pediatric): Secondary | ICD-10-CM | POA: Diagnosis not present

## 2023-11-09 DIAGNOSIS — N3091 Cystitis, unspecified with hematuria: Secondary | ICD-10-CM | POA: Diagnosis not present

## 2023-11-09 DIAGNOSIS — D126 Benign neoplasm of colon, unspecified: Secondary | ICD-10-CM | POA: Diagnosis not present

## 2023-12-18 DIAGNOSIS — L821 Other seborrheic keratosis: Secondary | ICD-10-CM | POA: Diagnosis not present

## 2024-01-10 DIAGNOSIS — E785 Hyperlipidemia, unspecified: Secondary | ICD-10-CM | POA: Diagnosis not present

## 2024-01-10 DIAGNOSIS — K219 Gastro-esophageal reflux disease without esophagitis: Secondary | ICD-10-CM | POA: Diagnosis not present

## 2024-01-10 DIAGNOSIS — L03116 Cellulitis of left lower limb: Secondary | ICD-10-CM | POA: Diagnosis not present

## 2024-01-10 DIAGNOSIS — I10 Essential (primary) hypertension: Secondary | ICD-10-CM | POA: Diagnosis not present

## 2024-01-10 DIAGNOSIS — L0390 Cellulitis, unspecified: Secondary | ICD-10-CM | POA: Diagnosis not present

## 2024-01-11 DIAGNOSIS — D235 Other benign neoplasm of skin of trunk: Secondary | ICD-10-CM | POA: Diagnosis not present

## 2024-01-11 DIAGNOSIS — D485 Neoplasm of uncertain behavior of skin: Secondary | ICD-10-CM | POA: Diagnosis not present

## 2024-01-11 DIAGNOSIS — L570 Actinic keratosis: Secondary | ICD-10-CM | POA: Diagnosis not present

## 2024-02-08 DIAGNOSIS — D485 Neoplasm of uncertain behavior of skin: Secondary | ICD-10-CM | POA: Diagnosis not present

## 2024-02-08 DIAGNOSIS — D235 Other benign neoplasm of skin of trunk: Secondary | ICD-10-CM | POA: Diagnosis not present

## 2024-02-08 DIAGNOSIS — L570 Actinic keratosis: Secondary | ICD-10-CM | POA: Diagnosis not present

## 2024-05-13 DIAGNOSIS — Z1389 Encounter for screening for other disorder: Secondary | ICD-10-CM | POA: Diagnosis not present

## 2024-05-13 DIAGNOSIS — Z0001 Encounter for general adult medical examination with abnormal findings: Secondary | ICD-10-CM | POA: Diagnosis not present

## 2024-05-13 DIAGNOSIS — I10 Essential (primary) hypertension: Secondary | ICD-10-CM | POA: Diagnosis not present

## 2024-05-13 DIAGNOSIS — E7849 Other hyperlipidemia: Secondary | ICD-10-CM | POA: Diagnosis not present

## 2024-05-13 DIAGNOSIS — R7301 Impaired fasting glucose: Secondary | ICD-10-CM | POA: Diagnosis not present

## 2024-05-13 DIAGNOSIS — Z0000 Encounter for general adult medical examination without abnormal findings: Secondary | ICD-10-CM | POA: Diagnosis not present

## 2024-07-11 ENCOUNTER — Other Ambulatory Visit: Payer: Medicare Other

## 2024-07-11 DIAGNOSIS — R972 Elevated prostate specific antigen [PSA]: Secondary | ICD-10-CM

## 2024-07-13 LAB — PSA, TOTAL AND FREE
PSA, Free Pct: 26.2 %
PSA, Free: 0.76 ng/mL
Prostate Specific Ag, Serum: 2.9 ng/mL (ref 0.0–4.0)

## 2024-07-15 ENCOUNTER — Ambulatory Visit: Payer: Self-pay

## 2024-07-18 ENCOUNTER — Ambulatory Visit: Payer: Medicare Other | Admitting: Urology

## 2024-07-31 ENCOUNTER — Other Ambulatory Visit: Payer: Self-pay | Admitting: Urology

## 2024-07-31 DIAGNOSIS — N401 Enlarged prostate with lower urinary tract symptoms: Secondary | ICD-10-CM

## 2024-08-12 NOTE — Progress Notes (Incomplete)
 HPI:  9..16.2025:  . 07/20/23: [name] returns today in f/u.  He has a history of BPH with BOO and an elevated PSA that is down to 2.9 with a 28% f/t ratio on finasteride .   He may have had 1 UTI over the past year.  He has no UTI symptoms but he has persistent pyuria and bacteriuria with e. Coli on prior cultures.     06/09/22: [name] returns today in f/u.  His PSA is stable at 4.4 with a 20.7% f/t ratio.  He is voiding well on finasteride  with an IPSS of 1 and nocturia x 1.  He has had some cloudy urine but it has cleared.  He has had no hematuria or dysuria.    12/09/21:  [name] returns today in f/u.  His PSA is 4.3 with a 20.5% f/t ratio. He remains on finasteride  which was started in 5/22.  He is voiding well with an IPSS of 2.  He has had some transient cloudy urine but he has had no hematuria or symptomatic UTI's.   05/27/21: Benjamin returns today in f/u.  He had e. Coli again on his culture on 04/01/21.  He passed a voiding trial on 04/05/21.  He was treated with keflex  for the UTI.  He remains on finasteride .   He continues to void well with an IPSS of 2.  His UA today is clear.  He has had no side effects with the finasteride .   04/01/21: [name] returns today in f/u with the onset Tuesday of gross hematuria which has been progressive and associated with marked irritative voiding symtoms and passage of clots.   He has had some fever about a week ago.  He has no flank pain.  He has suprapubic pressure.  His UA looked infected.   Prior to onset he had no voiding complaints.    GU Hx: Mr. Dority is sent in consultation by Dr. Lari for an elevated PSA of 9.7 in 10/18 which was up from 7.2 in 5/18. He had a negative biopsy in 8/16 by Dr. Beola for a PSA os 10.2 in 3/16. He had a subsequent prostate MRI in 3/17 that showed a 6x82mm lesion at the right lateral base that could represent low grade cancer. He had another biopsy in 4/17 that was negative. He has had 2 prior TURPs with the last in  2014. He has had the bleeding intermittently since then but had a cystoscopy in 2017. He has had a history of UTI's prior to the TURP but no symptomatic infections since. He has no other associated signs or symptoms.   His last PSA was 7.9 in the fall of 2020 which is reasonably stable over the last few years. He has a history of UTI's and an elevated PSA. He is no longer on macrobid  for suppression. He has no dysuria. He has had intermittent hematuria that is long standing and he has an unusual sensation at the end of the stream.  He has had some intermittent terminal dysuria and occasional hematuria.         ROS:  ROS:  A complete review of systems was performed.  All systems are negative except for pertinent findings as noted.   Review of Systems  Gastrointestinal:  Positive for heartburn.  All other systems reviewed and are negative.         Allergies  Allergen Reactions   Fosinopril Cough    Outpatient Encounter Medications as of 08/13/2024  Medication Sig  atorvastatin (LIPITOR) 20 MG tablet Take 20 mg by mouth daily.   finasteride  (PROSCAR ) 5 MG tablet TAKE 1 TABLET (5 MG TOTAL) BY MOUTH DAILY.   hydrochlorothiazide (HYDRODIURIL) 12.5 MG tablet Take 12.5 mg by mouth daily.   losartan (COZAAR) 100 MG tablet Take 100 mg by mouth daily.   omeprazole (PRILOSEC) 20 MG capsule Take 20 mg by mouth daily.   No facility-administered encounter medications on file as of 08/13/2024.    Past Medical History:  Diagnosis Date   Anxiety    Asbestosis ([facility])    GERD (gastroesophageal reflux disease)    Hypercholesteremia    Hypertension     Past Surgical History:  Procedure Laterality Date   PROSTATE BIOPSY  02/2016   PROSTATE BIOPSY  06/2015   TRANSURETHRAL RESECTION OF PROSTATE  2014   He had one prior as well   VIDEO ASSISTED THORACOSCOPY (VATS)/DECORTICATION Left 2006    Social History   Socioeconomic History   Marital status: Married    Spouse name: Not on file    Number of children: Not on file   Years of education: Not on file   Highest education level: Not on file  Occupational History   Not on file  Tobacco Use   Smoking status: Never   Smokeless tobacco: Never  Substance and Sexual Activity   Alcohol use: Never   Drug use: Not on file   Sexual activity: Not on file  Other Topics Concern   Not on file  Social History Narrative   Not on file   Social Drivers of Health   Financial Resource Strain: Not on file  Food Insecurity: Not on file  Transportation Needs: Not on file  Physical Activity: Not on file  Stress: Not on file  Social Connections: Unknown (04/10/2022)   [facility]   Social Network    Social Network: Not on file  Intimate Partner Violence: Unknown (03/02/2022)   Received from [facility]   HITS    Physically Hurt: Not on file    Insult or Talk Down To: Not on file    Threaten Physical Harm: Not on file    Scream or Curse: Not on file    Family History  Problem Relation Age of Onset   Nephrolithiasis Father        Objective: There were no vitals filed for this visit.    Physical Exam Vitals reviewed.  Constitutional:      Appearance: Normal appearance. He is obese.  Neurological:     Mental Status: He is alert.     Lab Results:  No results found for this or any previous visit (from the past 24 hours).  UA has >30 WBC, 3-10 RBC's  and mod bacteria.  Lab Results  Component Value Date   PSA1 2.9 07/11/2024   PSA1 2.9 06/05/2023   PSA1 4.4 (H) 05/26/2022    Studies/Results:     Assessment & Plan: Hx of chronic hemorrhagic cystitis with BPH and BOO.  He has been doing well on finatesteride with no further hematuria or symptomatic UTI's but his UA looks colonized today.  I will get a culture. .  Elevated PSA.  His PSA has fallen appropriately with finasteride .     No orders of the defined types were placed in this encounter.    No orders of the defined types were  placed in this encounter.     No follow-ups on file.   CC: [name], [name], MD  [name] 08/12/2024 Patient ID: Benjamin Colon Seed, male   DOB: 31-Jan-1951, 73 y.o.   MRN: [id number] Patient ID: Benjamin Colon, male   DOB: 1951-05-02, 73 y.o.   MRN: [id number]

## 2024-08-13 ENCOUNTER — Ambulatory Visit: Admitting: Urology

## 2024-08-13 DIAGNOSIS — N3021 Other chronic cystitis with hematuria: Secondary | ICD-10-CM

## 2024-08-13 DIAGNOSIS — R972 Elevated prostate specific antigen [PSA]: Secondary | ICD-10-CM

## 2024-08-13 DIAGNOSIS — N401 Enlarged prostate with lower urinary tract symptoms: Secondary | ICD-10-CM

## 2024-08-13 DIAGNOSIS — R351 Nocturia: Secondary | ICD-10-CM

## 2024-08-13 NOTE — Progress Notes (Deleted)
 HPI:  9..16.2025: Here for annual check.  Most recent PSA 2.9 (26% free)-he is on finasteride .   07/20/23: Benjamin Colon returns today in f/u.  He has a history of BPH with BOO and an elevated PSA that is down to 2.9 with a 28% f/t ratio on finasteride .   He may have had 1 UTI over the past year.  He has no UTI symptoms but he has persistent pyuria and bacteriuria with e. Coli on prior cultures.     06/09/22: Benjamin Colon returns today in f/u.  His PSA is stable at 4.4 with a 20.7% f/t ratio.  He is voiding well on finasteride  with an IPSS of 1 and nocturia x 1.  He has had some cloudy urine but it has cleared.  He has had no hematuria or dysuria.    12/09/21:  Benjamin Colon returns today in f/u.  His PSA is 4.3 with a 20.5% f/t ratio. He remains on finasteride  which was started in 5/22.  He is voiding well with an IPSS of 2.  He has had some transient cloudy urine but he has had no hematuria or symptomatic UTI's.   05/27/21: Benjamin Colon returns today in f/u.  He had e. Coli again on his culture on 04/01/21.  He passed a voiding trial on 04/05/21.  He was treated with keflex  for the UTI.  He remains on finasteride .   He continues to void well with an IPSS of 2.  His UA today is clear.  He has had no side effects with the finasteride .   04/01/21: Benjamin Colon returns today in f/u with the onset Tuesday of gross hematuria which has been progressive and associated with marked irritative voiding symtoms and passage of clots.   He has had some fever about a week ago.  He has no flank pain.  He has suprapubic pressure.  His UA looked infected.   Prior to onset he had no voiding complaints.    GU Hx: Benjamin Colon is sent in consultation by Dr. Lari for an elevated PSA of 9.7 in 10/18 which was up from 7.2 in 5/18. He had a negative biopsy in 8/16 by Dr. Beola for a PSA os 10.2 in 3/16. He had a subsequent prostate MRI in 3/17 that showed a 6x61mm lesion at the right lateral base that could represent low grade cancer. He had another  biopsy in 4/17 that was negative. He has had 2 prior TURPs with the last in 2014. He has had the bleeding intermittently since then but had a cystoscopy in 2017. He has had a history of UTI's prior to the TURP but no symptomatic infections since. He has no other associated signs or symptoms.   His last PSA was 7.9 in the fall of 2020 which is reasonably stable over the last few years. He has a history of UTI's and an elevated PSA. He is no longer on macrobid  for suppression. He has no dysuria. He has had intermittent hematuria that is long standing and he has an unusual sensation at the end of the stream.  He has had some intermittent terminal dysuria and occasional hematuria.         ROS:  ROS:  A complete review of systems was performed.  All systems are negative except for pertinent findings as noted.   Review of Systems  Gastrointestinal:  Positive for heartburn.  All other systems reviewed and are negative.         Allergies  Allergen Reactions   Fosinopril Cough  Outpatient Encounter Medications as of 08/13/2024  Medication Sig   atorvastatin (LIPITOR) 20 MG tablet Take 20 mg by mouth daily.   finasteride  (PROSCAR ) 5 MG tablet TAKE 1 TABLET (5 MG TOTAL) BY MOUTH DAILY.   hydrochlorothiazide (HYDRODIURIL) 12.5 MG tablet Take 12.5 mg by mouth daily.   losartan (COZAAR) 100 MG tablet Take 100 mg by mouth daily.   omeprazole (PRILOSEC) 20 MG capsule Take 20 mg by mouth daily.   No facility-administered encounter medications on file as of 08/13/2024.    Past Medical History:  Diagnosis Date   Anxiety    Asbestosis (HCC)    GERD (gastroesophageal reflux disease)    Hypercholesteremia    Hypertension     Past Surgical History:  Procedure Laterality Date   PROSTATE BIOPSY  02/2016   PROSTATE BIOPSY  06/2015   TRANSURETHRAL RESECTION OF PROSTATE  2014   He had one prior as well   VIDEO ASSISTED THORACOSCOPY (VATS)/DECORTICATION Left 2006    Social History    Socioeconomic History   Marital status: Married    Spouse name: Not on file   Number of children: Not on file   Years of education: Not on file   Highest education level: Not on file  Occupational History   Not on file  Tobacco Use   Smoking status: Never   Smokeless tobacco: Never  Substance and Sexual Activity   Alcohol use: Never   Drug use: Not on file   Sexual activity: Not on file  Other Topics Concern   Not on file  Social History Narrative   Not on file   Social Drivers of Health   Financial Resource Strain: Not on file  Food Insecurity: Not on file  Transportation Needs: Not on file  Physical Activity: Not on file  Stress: Not on file  Social Connections: Unknown (04/10/2022)   Received from [facility]   Social Network    Social Network: Not on file  Intimate Partner Violence: Unknown (03/02/2022)   Received from [facility]   HITS    Physically Hurt: Not on file    Insult or Talk Down To: Not on file    Threaten Physical Harm: Not on file    Scream or Curse: Not on file    Family History  Problem Relation Age of Onset   Nephrolithiasis Father        Objective: There were no vitals filed for this visit.    Physical Exam Vitals reviewed.  Constitutional:      Appearance: Normal appearance. He is obese.  Neurological:     Mental Status: He is alert.     Lab Results:  No results found for this or any previous visit (from the past 24 hours).  UA has >30 WBC, 3-10 RBC's  and mod bacteria.  Lab Results  Component Value Date   PSA1 2.9 07/11/2024   PSA1 2.9 06/05/2023   PSA1 4.4 (H) 05/26/2022    Studies/Results:  CT scan images from 2019 reviewed.  Estimated prostate volume 160 mL   Assessment: 1.  BPH with lower urinary tract symptoms/outlet obstruction.  Managed with finasteride .  Doing well  2.  History of recurrent UTIs.    Plan:    No follow-ups on file.   CC: Benjamin Colon, Benjamin BRAVO, MD      Benjamin Colon  Benjamin Colon 08/12/2024 Patient ID: Benjamin Colon Benjamin Colon, male   DOB: 02/04/1951, 73 y.o.   MRN: [id number] Patient ID: Benjamin Colon Benjamin Colon, male  DOB: September 08, 1951, 73 y.o.   MRN: [id number]

## 2024-09-02 ENCOUNTER — Other Ambulatory Visit: Payer: Self-pay | Admitting: Urology

## 2024-09-02 DIAGNOSIS — N401 Enlarged prostate with lower urinary tract symptoms: Secondary | ICD-10-CM

## 2024-09-02 MED ORDER — FINASTERIDE 5 MG PO TABS
5.0000 mg | ORAL_TABLET | Freq: Every day | ORAL | 3 refills | Status: AC
Start: 2024-09-02 — End: ?

## 2024-09-02 NOTE — Telephone Encounter (Signed)
 Called and lvm that pt request was sent to his provider

## 2024-09-02 NOTE — Telephone Encounter (Signed)
 The patient called to request a medication refill of Finasteride . Patient would like the medication sent to [facility].

## 2025-01-14 ENCOUNTER — Other Ambulatory Visit

## 2025-01-21 ENCOUNTER — Ambulatory Visit: Admitting: Urology
